# Patient Record
Sex: Female | Born: 1940 | Race: White | Hispanic: No
Health system: Midwestern US, Academic
[De-identification: ages and names within clinical notes are randomized; demographics above are authoritative.]

---

## 2018-06-10 ENCOUNTER — Encounter: Admit: 2018-06-10 | Discharge: 2018-06-11 | Payer: MEDICARE

## 2018-07-10 ENCOUNTER — Encounter: Admit: 2018-07-10 | Discharge: 2018-07-10 | Payer: MEDICARE

## 2018-07-11 ENCOUNTER — Encounter: Admit: 2018-07-11 | Discharge: 2018-07-11 | Payer: MEDICARE

## 2018-07-14 ENCOUNTER — Encounter: Admit: 2018-07-14 | Discharge: 2018-07-14 | Payer: MEDICARE

## 2018-07-14 DIAGNOSIS — E785 Hyperlipidemia, unspecified: ICD-10-CM

## 2018-07-14 DIAGNOSIS — C539 Malignant neoplasm of cervix uteri, unspecified: Principal | ICD-10-CM

## 2018-07-14 DIAGNOSIS — R51 Headache: ICD-10-CM

## 2018-07-14 DIAGNOSIS — C541 Malignant neoplasm of endometrium: ICD-10-CM

## 2018-07-14 DIAGNOSIS — I2510 Atherosclerotic heart disease of native coronary artery without angina pectoris: ICD-10-CM

## 2018-07-14 DIAGNOSIS — I10 Essential (primary) hypertension: ICD-10-CM

## 2018-07-14 DIAGNOSIS — H547 Unspecified visual loss: ICD-10-CM

## 2018-07-14 DIAGNOSIS — M549 Dorsalgia, unspecified: ICD-10-CM

## 2018-07-14 DIAGNOSIS — H9190 Unspecified hearing loss, unspecified ear: ICD-10-CM

## 2018-07-14 LAB — CBC AND DIFF
Lab: 0 % (ref 0–2)
Lab: 0 % (ref >60)
Lab: 0 10*3/uL (ref 0–0.20)
Lab: 0 10*3/uL (ref 0–0.45)
Lab: 0.2 10*3/uL (ref 0–0.80)
Lab: 0.3 10*3/uL — ABNORMAL LOW (ref 1.8–7.0)
Lab: 10 10*3/uL — ABNORMAL HIGH (ref 4.5–11.0)
Lab: 102 FL — ABNORMAL HIGH (ref 80–100)
Lab: 121 10*3/uL — ABNORMAL LOW (ref 150–400)
Lab: 15 % — ABNORMAL HIGH (ref 11–15)
Lab: 2 % — ABNORMAL LOW (ref >60)
Lab: 2.8 M/UL — ABNORMAL LOW (ref 4.0–5.0)
Lab: 29 % — ABNORMAL LOW (ref 36–45)
Lab: 3 % — ABNORMAL LOW (ref 41–77)
Lab: 33 g/dL (ref 32.0–36.0)
Lab: 34 pg — ABNORMAL HIGH (ref 26–34)
Lab: 7.8 FL (ref 7–11)
Lab: 9.6 10*3/uL — ABNORMAL HIGH (ref 1.0–4.8)
Lab: 95 % — ABNORMAL HIGH (ref 24–44)

## 2018-07-14 LAB — COMPREHENSIVE METABOLIC PANEL
Lab: 103 MMOL/L (ref 98–110)
Lab: 135 MMOL/L — ABNORMAL LOW (ref 137–147)
Lab: 4.3 MMOL/L (ref 3.5–5.1)

## 2018-07-15 ENCOUNTER — Encounter: Admit: 2018-07-15 | Discharge: 2018-07-15 | Payer: MEDICARE

## 2018-07-15 DIAGNOSIS — C541 Malignant neoplasm of endometrium: Principal | ICD-10-CM

## 2018-07-15 MED ORDER — CEFAZOLIN INJ 1GM IVP
2 g | Freq: Once | INTRAVENOUS | 0 refills | Status: CN
Start: 2018-07-15 — End: ?

## 2018-07-16 ENCOUNTER — Encounter: Admit: 2018-07-16 | Discharge: 2018-07-16 | Payer: MEDICARE

## 2018-07-16 DIAGNOSIS — H547 Unspecified visual loss: ICD-10-CM

## 2018-07-16 DIAGNOSIS — H9190 Unspecified hearing loss, unspecified ear: ICD-10-CM

## 2018-07-16 DIAGNOSIS — M549 Dorsalgia, unspecified: ICD-10-CM

## 2018-07-16 DIAGNOSIS — E785 Hyperlipidemia, unspecified: Principal | ICD-10-CM

## 2018-07-16 DIAGNOSIS — I2510 Atherosclerotic heart disease of native coronary artery without angina pectoris: ICD-10-CM

## 2018-07-16 DIAGNOSIS — R51 Headache: ICD-10-CM

## 2018-07-16 DIAGNOSIS — C541 Malignant neoplasm of endometrium: Principal | ICD-10-CM

## 2018-07-16 DIAGNOSIS — I10 Essential (primary) hypertension: ICD-10-CM

## 2018-07-17 ENCOUNTER — Encounter: Admit: 2018-07-17 | Discharge: 2018-07-17 | Payer: MEDICARE

## 2018-07-17 ENCOUNTER — Ambulatory Visit: Admit: 2018-07-17 | Discharge: 2018-07-18 | Payer: MEDICARE

## 2018-07-17 DIAGNOSIS — I25118 Atherosclerotic heart disease of native coronary artery with other forms of angina pectoris: ICD-10-CM

## 2018-07-17 DIAGNOSIS — M549 Dorsalgia, unspecified: ICD-10-CM

## 2018-07-17 DIAGNOSIS — I10 Essential (primary) hypertension: ICD-10-CM

## 2018-07-17 DIAGNOSIS — I2510 Atherosclerotic heart disease of native coronary artery without angina pectoris: ICD-10-CM

## 2018-07-17 DIAGNOSIS — E785 Hyperlipidemia, unspecified: Principal | ICD-10-CM

## 2018-07-17 DIAGNOSIS — R51 Headache: ICD-10-CM

## 2018-07-17 DIAGNOSIS — H547 Unspecified visual loss: ICD-10-CM

## 2018-07-17 DIAGNOSIS — H9190 Unspecified hearing loss, unspecified ear: ICD-10-CM

## 2018-07-17 DIAGNOSIS — Z01810 Encounter for preprocedural cardiovascular examination: Principal | ICD-10-CM

## 2018-07-17 DIAGNOSIS — I6523 Occlusion and stenosis of bilateral carotid arteries: ICD-10-CM

## 2018-07-23 ENCOUNTER — Encounter: Admit: 2018-07-23 | Discharge: 2018-07-24 | Payer: MEDICARE

## 2018-07-23 ENCOUNTER — Encounter: Admit: 2018-07-23 | Discharge: 2018-07-23 | Payer: MEDICARE

## 2018-07-23 DIAGNOSIS — R51 Headache: ICD-10-CM

## 2018-07-23 DIAGNOSIS — H9190 Unspecified hearing loss, unspecified ear: ICD-10-CM

## 2018-07-23 DIAGNOSIS — E785 Hyperlipidemia, unspecified: Principal | ICD-10-CM

## 2018-07-23 DIAGNOSIS — I10 Essential (primary) hypertension: ICD-10-CM

## 2018-07-23 DIAGNOSIS — M549 Dorsalgia, unspecified: ICD-10-CM

## 2018-07-23 DIAGNOSIS — C541 Malignant neoplasm of endometrium: Principal | ICD-10-CM

## 2018-07-23 DIAGNOSIS — H547 Unspecified visual loss: ICD-10-CM

## 2018-07-23 DIAGNOSIS — I2510 Atherosclerotic heart disease of native coronary artery without angina pectoris: ICD-10-CM

## 2018-07-23 MED ORDER — SODIUM CHLORIDE 0.9 % IJ SOLN
50 mL | Freq: Once | INTRAVENOUS | 0 refills | Status: CP
Start: 2018-07-23 — End: ?
  Administered 2018-07-23: 14:00:00 50 mL via INTRAVENOUS

## 2018-07-23 MED ORDER — SODIUM CHLORIDE 0.9 % IJ SOLN
50 mL | Freq: Once | INTRAVENOUS | 0 refills | Status: CP
Start: 2018-07-23 — End: ?
  Administered 2018-07-23: 15:00:00 50 mL via INTRAVENOUS

## 2018-07-23 MED ORDER — IOHEXOL 350 MG IODINE/ML IV SOLN
100 mL | Freq: Once | INTRAVENOUS | 0 refills | Status: CP
Start: 2018-07-23 — End: ?
  Administered 2018-07-23: 15:00:00 100 mL via INTRAVENOUS

## 2018-07-23 MED ORDER — GADOBENATE DIMEGLUMINE 529 MG/ML (0.1MMOL/0.2ML) IV SOLN
14 mL | Freq: Once | INTRAVENOUS | 0 refills | Status: CP
Start: 2018-07-23 — End: ?
  Administered 2018-07-23: 14:00:00 14 mL via INTRAVENOUS

## 2018-07-28 ENCOUNTER — Encounter: Admit: 2018-07-28 | Discharge: 2018-07-28 | Payer: MEDICARE

## 2018-07-28 ENCOUNTER — Inpatient Hospital Stay: Admit: 2018-07-28 | Discharge: 2018-07-28 | Payer: MEDICARE

## 2018-07-28 DIAGNOSIS — C541 Malignant neoplasm of endometrium: Principal | ICD-10-CM

## 2018-07-29 ENCOUNTER — Encounter: Admit: 2018-07-29 | Discharge: 2018-07-29 | Payer: MEDICARE

## 2018-07-29 ENCOUNTER — Ambulatory Visit: Admit: 2018-07-29 | Discharge: 2018-07-30 | Payer: MEDICARE

## 2018-07-29 DIAGNOSIS — I6529 Occlusion and stenosis of unspecified carotid artery: ICD-10-CM

## 2018-07-29 DIAGNOSIS — M549 Dorsalgia, unspecified: ICD-10-CM

## 2018-07-29 DIAGNOSIS — C541 Malignant neoplasm of endometrium: Principal | ICD-10-CM

## 2018-07-29 DIAGNOSIS — I2510 Atherosclerotic heart disease of native coronary artery without angina pectoris: ICD-10-CM

## 2018-07-29 DIAGNOSIS — I10 Essential (primary) hypertension: ICD-10-CM

## 2018-07-29 DIAGNOSIS — H547 Unspecified visual loss: ICD-10-CM

## 2018-07-29 DIAGNOSIS — R0602 Shortness of breath: ICD-10-CM

## 2018-07-29 DIAGNOSIS — R112 Nausea with vomiting, unspecified: ICD-10-CM

## 2018-07-29 DIAGNOSIS — E785 Hyperlipidemia, unspecified: Principal | ICD-10-CM

## 2018-07-29 DIAGNOSIS — Z9289 Personal history of other medical treatment: ICD-10-CM

## 2018-07-29 DIAGNOSIS — R51 Headache: ICD-10-CM

## 2018-07-29 DIAGNOSIS — K219 Gastro-esophageal reflux disease without esophagitis: ICD-10-CM

## 2018-07-29 DIAGNOSIS — Z01818 Encounter for other preprocedural examination: ICD-10-CM

## 2018-07-29 DIAGNOSIS — I208 Other forms of angina pectoris: ICD-10-CM

## 2018-07-29 DIAGNOSIS — H9190 Unspecified hearing loss, unspecified ear: ICD-10-CM

## 2018-07-29 DIAGNOSIS — Z955 Presence of coronary angioplasty implant and graft: ICD-10-CM

## 2018-07-29 LAB — CBC
Lab: 100 FL — ABNORMAL HIGH (ref 80–100)
Lab: 115 10*3/uL — ABNORMAL LOW (ref 150–400)
Lab: 15 % (ref 11–15)
Lab: 2.6 M/UL — ABNORMAL LOW (ref >60)
Lab: 26 % — ABNORMAL LOW (ref 36–45)
Lab: 34 g/dL (ref 32.0–36.0)
Lab: 34 pg — ABNORMAL HIGH (ref 26–34)
Lab: 7.7 FL (ref 7–11)
Lab: 9.1 g/dL — ABNORMAL LOW (ref >60)
Lab: 9.5 K/UL (ref 4.5–11.0)

## 2018-07-30 DIAGNOSIS — Z01818 Encounter for other preprocedural examination: Principal | ICD-10-CM

## 2018-08-05 ENCOUNTER — Encounter: Admit: 2018-08-05 | Discharge: 2018-08-05 | Payer: MEDICARE

## 2018-08-05 ENCOUNTER — Inpatient Hospital Stay
Admit: 2018-08-05 | Discharge: 2018-08-12 | Disposition: A | Discharge status: Home / Self Care | Payer: MEDICARE | Attending: Student in an Organized Health Care Education/Training Program | Admitting: Gynecologic Oncology

## 2018-08-05 ENCOUNTER — Ambulatory Visit: Admit: 2018-08-05 | Discharge: 2018-08-06 | Payer: MEDICARE

## 2018-08-05 DIAGNOSIS — K219 Gastro-esophageal reflux disease without esophagitis: ICD-10-CM

## 2018-08-05 DIAGNOSIS — I2510 Atherosclerotic heart disease of native coronary artery without angina pectoris: ICD-10-CM

## 2018-08-05 DIAGNOSIS — Z955 Presence of coronary angioplasty implant and graft: ICD-10-CM

## 2018-08-05 DIAGNOSIS — E785 Hyperlipidemia, unspecified: Principal | ICD-10-CM

## 2018-08-05 DIAGNOSIS — R112 Nausea with vomiting, unspecified: ICD-10-CM

## 2018-08-05 DIAGNOSIS — I6529 Occlusion and stenosis of unspecified carotid artery: ICD-10-CM

## 2018-08-05 DIAGNOSIS — I208 Other forms of angina pectoris: ICD-10-CM

## 2018-08-05 DIAGNOSIS — H547 Unspecified visual loss: ICD-10-CM

## 2018-08-05 DIAGNOSIS — M549 Dorsalgia, unspecified: ICD-10-CM

## 2018-08-05 DIAGNOSIS — R0602 Shortness of breath: ICD-10-CM

## 2018-08-05 DIAGNOSIS — R51 Headache: ICD-10-CM

## 2018-08-05 DIAGNOSIS — H9190 Unspecified hearing loss, unspecified ear: ICD-10-CM

## 2018-08-05 DIAGNOSIS — Z9289 Personal history of other medical treatment: ICD-10-CM

## 2018-08-05 DIAGNOSIS — I10 Essential (primary) hypertension: ICD-10-CM

## 2018-08-05 LAB — GLUCOSE,BG
Lab: 111 mg/dL — ABNORMAL HIGH (ref 70–100)
Lab: 153 mg/dL — ABNORMAL HIGH (ref 70–100)

## 2018-08-05 LAB — BLOOD GASES, ARTERIAL
Lab: 24 MMOL/L (ref 21–28)
Lab: 7.3 % — ABNORMAL LOW (ref 7.35–7.45)
Lab: 7.3 % — ABNORMAL LOW (ref 7.35–7.45)

## 2018-08-05 LAB — SODIUM,BG
Lab: 134 MMOL/L — ABNORMAL LOW (ref 137–147)
Lab: 133 MMOL/L — ABNORMAL LOW (ref 137–147)

## 2018-08-05 LAB — HEMOGLOBIN & HEMATOCRIT, BG
Lab: 9 g/dL — ABNORMAL LOW (ref 12.0–15.0)
Lab: 9.6 g/dL — ABNORMAL LOW (ref 12.0–15.0)

## 2018-08-05 LAB — POTASSIUM, BG
Lab: 4.4 MMOL/L — ABNORMAL HIGH (ref 3.5–5.1)
Lab: 4.4 MMOL/L — ABNORMAL HIGH (ref >60)

## 2018-08-05 LAB — IONIZED CALCIUM,BG
Lab: 1.1 MMOL/L — ABNORMAL HIGH (ref 1.0–1.3)
Lab: 1 MMOL/L — ABNORMAL HIGH (ref 1.0–1.3)

## 2018-08-05 LAB — LACTIC ACID (BG - RAPID LACTATE): Lab: 0.9 MMOL/L (ref >60)

## 2018-08-05 MED ORDER — ONDANSETRON HCL (PF) 4 MG/2 ML IJ SOLN
4 mg | INTRAVENOUS | 0 refills | Status: DC | PRN
Start: 2018-08-05 — End: 2018-08-06
  Administered 2018-08-06: 16:00:00 4 mg via INTRAVENOUS

## 2018-08-05 MED ORDER — ROCURONIUM 10 MG/ML IV SOLN
INTRAVENOUS | 0 refills | Status: DC
Start: 2018-08-05 — End: 2018-08-06
  Administered 2018-08-05: 23:00:00 10 mg via INTRAVENOUS
  Administered 2018-08-05: 20:00:00 50 mg via INTRAVENOUS
  Administered 2018-08-05: 22:00:00 10 mg via INTRAVENOUS

## 2018-08-05 MED ORDER — MORPHINE PCA 50 MG/50 ML SYR (STD CONC)(ADULT)
INTRAVENOUS | 0 refills | Status: DC
Start: 2018-08-05 — End: 2018-08-06
  Administered 2018-08-06: 02:00:00 50.000 mL via INTRAVENOUS

## 2018-08-05 MED ORDER — ACETAMINOPHEN 500 MG PO TAB
1000 mg | Freq: Once | ORAL | 0 refills | Status: CP
Start: 2018-08-05 — End: ?
  Administered 2018-08-05: 19:00:00 1000 mg via ORAL

## 2018-08-05 MED ORDER — ASPIRIN 81 MG PO TBEC
81 mg | Freq: Every evening | ORAL | 0 refills | Status: DC
Start: 2018-08-05 — End: 2018-08-08
  Administered 2018-08-07 – 2018-08-08 (×2): 81 mg via ORAL

## 2018-08-05 MED ORDER — ENOXAPARIN 40 MG/0.4 ML SC SYRG
40 mg | Freq: Every day | SUBCUTANEOUS | 0 refills | Status: DC
Start: 2018-08-05 — End: 2018-08-12
  Administered 2018-08-06 – 2018-08-11 (×6): 40 mg via SUBCUTANEOUS

## 2018-08-05 MED ORDER — CETIRIZINE 10 MG PO TAB
10 mg | Freq: Every evening | ORAL | 0 refills | Status: DC
Start: 2018-08-05 — End: 2018-08-08
  Administered 2018-08-06 – 2018-08-08 (×3): 10 mg via ORAL

## 2018-08-05 MED ORDER — SENNOSIDES-DOCUSATE SODIUM 8.6-50 MG PO TAB
1 | Freq: Two times a day (BID) | ORAL | 0 refills | Status: DC
Start: 2018-08-05 — End: 2018-08-08
  Administered 2018-08-06 – 2018-08-08 (×5): 1 via ORAL

## 2018-08-05 MED ORDER — ONDANSETRON HCL (PF) 4 MG/2 ML IJ SOLN
INTRAVENOUS | 0 refills | Status: DC
Start: 2018-08-05 — End: 2018-08-06
  Administered 2018-08-06: 4 mg via INTRAVENOUS

## 2018-08-05 MED ORDER — LIDOCAINE (PF) 10 MG/ML (1 %) IJ SOLN
.1-2 mL | INTRAMUSCULAR | 0 refills | Status: DC | PRN
Start: 2018-08-05 — End: 2018-08-06

## 2018-08-05 MED ORDER — HYDROMORPHONE (PF) 2 MG/ML IJ SYRG
.5 mg | INTRAVENOUS | 0 refills | Status: DC | PRN
Start: 2018-08-05 — End: 2018-08-06
  Administered 2018-08-06 (×4): 0.5 mg via INTRAVENOUS

## 2018-08-05 MED ORDER — OXYCODONE 5 MG PO TAB
5-10 mg | Freq: Once | ORAL | 0 refills | Status: DC | PRN
Start: 2018-08-05 — End: 2018-08-06

## 2018-08-05 MED ORDER — SODIUM CHLORIDE 0.9 % IV SOLP
INTRAVENOUS | 0 refills | Status: DC
Start: 2018-08-05 — End: 2018-08-06
  Administered 2018-08-06: 03:00:00 1000.000 mL via INTRAVENOUS

## 2018-08-05 MED ORDER — BUPIVACAINE LIPOSOMAL/BUPIVACAINE HCL INJECTION
Freq: Once | 0 refills | Status: CP
Start: 2018-08-05 — End: ?
  Administered 2018-08-05 (×2): 80 mL

## 2018-08-05 MED ORDER — PANTOPRAZOLE 40 MG PO TBEC
40 mg | Freq: Every day | ORAL | 0 refills | Status: DC
Start: 2018-08-05 — End: 2018-08-08
  Administered 2018-08-06 – 2018-08-08 (×3): 40 mg via ORAL

## 2018-08-05 MED ORDER — DEXTRAN 70-HYPROMELLOSE (PF) 0.1-0.3 % OP DPET
0 refills | Status: DC
Start: 2018-08-05 — End: 2018-08-06
  Administered 2018-08-05: 20:00:00 2 [drp] via OPHTHALMIC

## 2018-08-05 MED ORDER — PROPOFOL INJ 10 MG/ML IV VIAL
0 refills | Status: DC
Start: 2018-08-05 — End: 2018-08-06
  Administered 2018-08-05: 20:00:00 80 mg via INTRAVENOUS

## 2018-08-05 MED ORDER — ROSUVASTATIN 10 MG PO TAB
20 mg | Freq: Every evening | ORAL | 0 refills | Status: DC
Start: 2018-08-05 — End: 2018-08-08
  Administered 2018-08-06 – 2018-08-08 (×3): 20 mg via ORAL

## 2018-08-05 MED ORDER — ACETAMINOPHEN 500 MG PO TAB
1000 mg | ORAL | 0 refills | Status: DC | PRN
Start: 2018-08-05 — End: 2018-08-06
  Administered 2018-08-06 (×2): 1000 mg via ORAL

## 2018-08-05 MED ORDER — IOPAMIDOL 61 % 50 ML IV SOLN (OT)(OSM)
0 refills | Status: DC
Start: 2018-08-05 — End: 2018-08-06
  Administered 2018-08-06: 01:00:00 25 mL via INTRAMUSCULAR

## 2018-08-05 MED ORDER — HALOPERIDOL LACTATE 5 MG/ML IJ SOLN
1 mg | Freq: Once | INTRAVENOUS | 0 refills | Status: DC | PRN
Start: 2018-08-05 — End: 2018-08-06

## 2018-08-05 MED ORDER — LIDOCAINE (PF) 200 MG/10 ML (2 %) IJ SYRG
0 refills | Status: DC
Start: 2018-08-05 — End: 2018-08-06
  Administered 2018-08-05: 20:00:00 60 mg via INTRAVENOUS

## 2018-08-05 MED ORDER — LACTATED RINGERS IV SOLP
1000 mL | INTRAVENOUS | 0 refills | Status: DC
Start: 2018-08-05 — End: 2018-08-07
  Administered 2018-08-05: 23:00:00 1000.000 mL via INTRAVENOUS

## 2018-08-05 MED ORDER — METOPROLOL SUCCINATE 50 MG PO TB24
50 mg | Freq: Every evening | ORAL | 0 refills | Status: DC
Start: 2018-08-05 — End: 2018-08-08
  Administered 2018-08-06 – 2018-08-08 (×3): 50 mg via ORAL

## 2018-08-05 MED ORDER — SUGAMMADEX 100 MG/ML IV SOLN
INTRAVENOUS | 0 refills | Status: DC
Start: 2018-08-05 — End: 2018-08-06
  Administered 2018-08-06: 01:00:00 140 mg via INTRAVENOUS

## 2018-08-05 MED ORDER — ELECTROLYTE-A IV SOLP
0 refills | Status: DC
Start: 2018-08-05 — End: 2018-08-06
  Administered 2018-08-05: 20:00:00 via INTRAVENOUS

## 2018-08-05 MED ORDER — FENTANYL CITRATE (PF) 50 MCG/ML IJ SOLN
0 refills | Status: DC
Start: 2018-08-05 — End: 2018-08-06
  Administered 2018-08-05 (×2): 25 ug via INTRAVENOUS
  Administered 2018-08-05: 22:00:00 50 ug via INTRAVENOUS
  Administered 2018-08-05: 20:00:00 100 ug via INTRAVENOUS
  Administered 2018-08-05: 23:00:00 50 ug via INTRAVENOUS

## 2018-08-05 MED ORDER — MIDAZOLAM 1 MG/ML IJ SOLN
INTRAVENOUS | 0 refills | Status: DC
Start: 2018-08-05 — End: 2018-08-06
  Administered 2018-08-05: 19:00:00 2 mg via INTRAVENOUS

## 2018-08-05 MED ORDER — DEXAMETHASONE SODIUM PHOSPHATE 4 MG/ML IJ SOLN
INTRAVENOUS | 0 refills | Status: DC
Start: 2018-08-05 — End: 2018-08-06
  Administered 2018-08-05: 20:00:00 4 mg via INTRAVENOUS

## 2018-08-05 MED ORDER — CEFAZOLIN INJ 1GM IVP
2 g | Freq: Once | INTRAVENOUS | 0 refills | Status: CP
Start: 2018-08-05 — End: ?
  Administered 2018-08-05 – 2018-08-06 (×2): 2 g via INTRAVENOUS

## 2018-08-05 MED ORDER — POLYETHYLENE GLYCOL 3350 17 GRAM PO PWPK
17 g | Freq: Every day | ORAL | 0 refills | Status: DC
Start: 2018-08-05 — End: 2018-08-08
  Administered 2018-08-06: 13:00:00 17 g via ORAL

## 2018-08-05 MED ORDER — FENTANYL CITRATE (PF) 50 MCG/ML IJ SOLN
25-50 ug | INTRAVENOUS | 0 refills | Status: DC | PRN
Start: 2018-08-05 — End: 2018-08-06
  Administered 2018-08-06 (×2): 50 ug via INTRAVENOUS

## 2018-08-05 MED ORDER — ELECTROLYTE-R (PH 7.4) IV SOLP
0 refills | Status: DC
Start: 2018-08-05 — End: 2018-08-05

## 2018-08-05 MED ORDER — NALOXONE 0.4 MG/ML IJ SOLN
.08 mg | INTRAVENOUS | 0 refills | Status: DC | PRN
Start: 2018-08-05 — End: 2018-08-06

## 2018-08-05 MED ADMIN — LACTATED RINGERS IV SOLP [4318]: 1000 mL | INTRAVENOUS | @ 18:00:00 | Stop: 2018-08-05 | NDC 00338011704

## 2018-08-06 ENCOUNTER — Encounter: Admit: 2018-08-06 | Discharge: 2018-08-06 | Payer: MEDICARE

## 2018-08-06 LAB — CBC
Lab: 100 10*3/uL — ABNORMAL LOW (ref 150–400)
Lab: 14 10*3/uL — ABNORMAL HIGH (ref 4.5–11.0)
Lab: 17 % — ABNORMAL HIGH (ref 11–15)
Lab: 2.4 M/UL — ABNORMAL LOW (ref 4.0–5.0)
Lab: 23 % — ABNORMAL LOW (ref 36–45)
Lab: 33 pg (ref 26–34)
Lab: 35 g/dL (ref 32.0–36.0)
Lab: 7.6 FL (ref 7–11)
Lab: 7.8 10*3/uL (ref 4.5–11.0)
Lab: 8.4 g/dL — ABNORMAL LOW (ref 12.0–15.0)
Lab: 96 FL (ref 80–100)

## 2018-08-06 LAB — BASIC METABOLIC PANEL
Lab: 0.7 mg/dL (ref 0.4–1.00)
Lab: 103 MMOL/L (ref 98–110)
Lab: 11 mg/dL (ref 7–25)
Lab: 122 mg/dL — ABNORMAL HIGH (ref 70–100)
Lab: 133 MMOL/L — ABNORMAL LOW (ref 137–147)
Lab: 23 MMOL/L (ref 21–30)
Lab: 4.8 MMOL/L (ref 3.5–5.1)
Lab: 8 mg/dL — ABNORMAL LOW (ref 8.5–10.6)
Lab: >60 mL/min (ref >60)
Lab: >60 mL/min (ref >60)
Lab: 7 (ref 3–12)

## 2018-08-06 LAB — LACTIC ACID (BG - RAPID LACTATE): Lab: 0.9 MMOL/L (ref 0.5–2.0)

## 2018-08-06 LAB — POTASSIUM, BG: Lab: 4.8 MMOL/L — ABNORMAL HIGH (ref 3.5–5.1)

## 2018-08-06 LAB — GLUCOSE,BG: Lab: 142 mg/dL — ABNORMAL HIGH (ref 70–100)

## 2018-08-06 LAB — MAGNESIUM: Lab: 2.1 mg/dL — ABNORMAL LOW (ref >60)

## 2018-08-06 LAB — PHOSPHORUS: Lab: 3.4 mg/dL — ABNORMAL LOW (ref >60)

## 2018-08-06 LAB — SODIUM,BG: Lab: 132 MMOL/L — ABNORMAL LOW (ref 137–147)

## 2018-08-06 LAB — IONIZED CALCIUM,BG: Lab: 1 MMOL/L — ABNORMAL HIGH (ref 1.0–1.3)

## 2018-08-06 LAB — BLOOD GASES, ARTERIAL: Lab: 7.3 (ref 7.35–7.45)

## 2018-08-06 MED ORDER — SIMETHICONE 80 MG PO CHEW
80 mg | ORAL | 0 refills | Status: DC | PRN
Start: 2018-08-06 — End: 2018-08-08
  Administered 2018-08-07: 19:00:00 80 mg via ORAL

## 2018-08-06 MED ORDER — PROCHLORPERAZINE EDISYLATE 5 MG/ML IJ SOLN
10 mg | INTRAVENOUS | 0 refills | Status: DC | PRN
Start: 2018-08-06 — End: 2018-08-08
  Administered 2018-08-06 – 2018-08-07 (×2): 10 mg via INTRAVENOUS

## 2018-08-06 MED ORDER — SODIUM CHLORIDE 0.9 % IV SOLP
INTRAVENOUS | 0 refills | Status: DC
Start: 2018-08-06 — End: 2018-08-08
  Administered 2018-08-06 – 2018-08-08 (×4): 1000.000 mL via INTRAVENOUS

## 2018-08-06 MED ORDER — HYDROCODONE-ACETAMINOPHEN 5-325 MG PO TAB
1-2 | ORAL | 0 refills | Status: DC | PRN
Start: 2018-08-06 — End: 2018-08-07
  Administered 2018-08-06 – 2018-08-07 (×3): 1 via ORAL

## 2018-08-06 MED ORDER — ENOXAPARIN 40 MG/0.4 ML SC SYRG
40 mg | INJECTION | Freq: Every day | SUBCUTANEOUS | 0 refills | Status: SS
Start: 2018-08-06 — End: 2018-09-24

## 2018-08-06 MED ORDER — OXYCODONE 5 MG PO TAB
5-10 mg | ORAL | 0 refills | Status: DC | PRN
Start: 2018-08-06 — End: 2018-08-06
  Administered 2018-08-06: 13:00:00 5 mg via ORAL

## 2018-08-06 MED ORDER — ONDANSETRON HCL (PF) 4 MG/2 ML IJ SOLN
4 mg | INTRAVENOUS | 0 refills | Status: DC | PRN
Start: 2018-08-06 — End: 2018-08-12
  Administered 2018-08-07 – 2018-08-11 (×3): 4 mg via INTRAVENOUS

## 2018-08-06 MED ORDER — FENTANYL CITRATE (PF) 50 MCG/ML IJ SOLN
25 ug | INTRAVENOUS | 0 refills | Status: DC | PRN
Start: 2018-08-06 — End: 2018-08-12
  Administered 2018-08-06 – 2018-08-10 (×3): 25 ug via INTRAVENOUS

## 2018-08-07 ENCOUNTER — Inpatient Hospital Stay: Admit: 2018-08-07 | Discharge: 2018-08-07 | Payer: MEDICARE

## 2018-08-07 ENCOUNTER — Encounter: Admit: 2018-08-07 | Discharge: 2018-08-07 | Payer: MEDICARE

## 2018-08-07 DIAGNOSIS — E785 Hyperlipidemia, unspecified: Principal | ICD-10-CM

## 2018-08-07 DIAGNOSIS — I10 Essential (primary) hypertension: ICD-10-CM

## 2018-08-07 DIAGNOSIS — Z9289 Personal history of other medical treatment: ICD-10-CM

## 2018-08-07 DIAGNOSIS — H9190 Unspecified hearing loss, unspecified ear: ICD-10-CM

## 2018-08-07 DIAGNOSIS — M549 Dorsalgia, unspecified: ICD-10-CM

## 2018-08-07 DIAGNOSIS — I6529 Occlusion and stenosis of unspecified carotid artery: ICD-10-CM

## 2018-08-07 DIAGNOSIS — I2510 Atherosclerotic heart disease of native coronary artery without angina pectoris: ICD-10-CM

## 2018-08-07 DIAGNOSIS — R112 Nausea with vomiting, unspecified: ICD-10-CM

## 2018-08-07 DIAGNOSIS — R51 Headache: ICD-10-CM

## 2018-08-07 DIAGNOSIS — Z955 Presence of coronary angioplasty implant and graft: ICD-10-CM

## 2018-08-07 DIAGNOSIS — K219 Gastro-esophageal reflux disease without esophagitis: ICD-10-CM

## 2018-08-07 DIAGNOSIS — I208 Other forms of angina pectoris: ICD-10-CM

## 2018-08-07 DIAGNOSIS — R0602 Shortness of breath: ICD-10-CM

## 2018-08-07 DIAGNOSIS — H547 Unspecified visual loss: ICD-10-CM

## 2018-08-07 LAB — BASIC METABOLIC PANEL
Lab: 0.9 mg/dL (ref 0.4–1.00)
Lab: 104 MMOL/L (ref 98–110)
Lab: 112 mg/dL — ABNORMAL HIGH (ref 70–100)
Lab: 132 MMOL/L — ABNORMAL LOW (ref 137–147)
Lab: 18 mg/dL (ref 7–25)
Lab: 21 MMOL/L (ref 21–30)
Lab: 4.6 MMOL/L (ref 3.5–5.1)
Lab: 55 mL/min — ABNORMAL LOW (ref >60)
Lab: 7 (ref 3–12)
Lab: 7.7 mg/dL — ABNORMAL LOW (ref 8.5–10.6)

## 2018-08-07 LAB — PHOSPHORUS: Lab: 2.4 mg/dL — ABNORMAL HIGH (ref 2.0–4.5)

## 2018-08-07 LAB — CBC: Lab: 17 K/UL — ABNORMAL HIGH (ref >60)

## 2018-08-07 LAB — MAGNESIUM: Lab: 2 mg/dL — ABNORMAL LOW (ref 1.6–2.6)

## 2018-08-07 MED ORDER — SODIUM CHLORIDE 0.9 % IV SOLP
1000 mL | INTRAVENOUS | 0 refills | Status: CP
Start: 2018-08-07 — End: ?
  Administered 2018-08-07: 17:00:00 1000 mL via INTRAVENOUS

## 2018-08-07 MED ORDER — IBUPROFEN 600 MG PO TAB
600 mg | ORAL | 0 refills | Status: DC | PRN
Start: 2018-08-07 — End: 2018-08-08

## 2018-08-07 MED ORDER — MAGNESIUM HYDROXIDE 2,400 MG/10 ML PO SUSP
10 mL | ORAL | 0 refills | Status: DC | PRN
Start: 2018-08-07 — End: 2018-08-08

## 2018-08-07 MED ORDER — CALCIUM CARBONATE 200 MG CALCIUM (500 MG) PO CHEW
500-1000 mg | Freq: Every day | ORAL | 0 refills | Status: DC
Start: 2018-08-07 — End: 2018-08-08
  Administered 2018-08-07: 19:00:00 1000 mg via ORAL

## 2018-08-07 MED ORDER — ACETAMINOPHEN 500 MG PO TAB
1000 mg | ORAL | 0 refills | Status: DC
Start: 2018-08-07 — End: 2018-08-09
  Administered 2018-08-07 – 2018-08-08 (×4): 1000 mg via ORAL

## 2018-08-07 MED ORDER — LORAZEPAM 2 MG/ML IJ SOLN
.5-1 mg | Freq: Once | INTRAVENOUS | 0 refills | Status: CP
Start: 2018-08-07 — End: ?
  Administered 2018-08-07: 1 mg via INTRAVENOUS

## 2018-08-07 MED ORDER — PROMETHAZINE 25 MG/ML IJ SOLN
12.5 mg | INTRAVENOUS | 0 refills | Status: DC | PRN
Start: 2018-08-07 — End: 2018-08-12
  Administered 2018-08-07: 20:00:00 12.5 mg via INTRAVENOUS

## 2018-08-08 LAB — BASIC METABOLIC PANEL: Lab: 135 MMOL/L — ABNORMAL LOW (ref >60)

## 2018-08-08 LAB — CBC: Lab: 9.8 K/UL — ABNORMAL LOW (ref >60)

## 2018-08-08 LAB — DRAIN FLUID CREATININE: Lab: 0.6 mg/dL (ref 26–34)

## 2018-08-08 LAB — MAGNESIUM: Lab: 1.8 mg/dL — ABNORMAL LOW (ref >60)

## 2018-08-08 LAB — PHOSPHORUS: Lab: 1.9 mg/dL — ABNORMAL LOW (ref >60)

## 2018-08-08 LAB — LACTIC ACID(LACTATE): Lab: 0.8 MMOL/L (ref 0.5–2.0)

## 2018-08-08 MED ORDER — ACETAMINOPHEN 500 MG PO TAB
1000 mg | NASOGASTRIC | 0 refills | Status: DC
Start: 2018-08-08 — End: 2018-08-09

## 2018-08-08 MED ORDER — ACETAMINOPHEN 160 MG/5 ML PO SOLN
1000 mg | NASOGASTRIC | 0 refills | Status: DC
Start: 2018-08-08 — End: 2018-08-10
  Administered 2018-08-09 – 2018-08-10 (×5): 1000 mg via NASOGASTRIC

## 2018-08-08 MED ORDER — PANTOPRAZOLE 40 MG IV SOLR
40 mg | Freq: Every day | INTRAVENOUS | 0 refills | Status: DC
Start: 2018-08-08 — End: 2018-08-10
  Administered 2018-08-08 – 2018-08-10 (×3): 40 mg via INTRAVENOUS

## 2018-08-08 MED ORDER — METOPROLOL TARTRATE 5 MG/5 ML IV SOLN
2.5 mg | INTRAVENOUS | 0 refills | Status: DC
Start: 2018-08-08 — End: 2018-08-10
  Administered 2018-08-08 – 2018-08-10 (×9): 2.5 mg via INTRAVENOUS

## 2018-08-08 MED ORDER — SODIUM CHLORIDE 0.9 % IV SOLP
500 mL | INTRAVENOUS | 0 refills | Status: AC
Start: 2018-08-08 — End: ?

## 2018-08-08 MED ORDER — PHENOL 1.4 % MM SPRA
2 | OROMUCOSAL | 0 refills | Status: DC | PRN
Start: 2018-08-08 — End: 2018-08-12

## 2018-08-08 MED ORDER — SODIUM CHLORIDE 0.9 % IV SOLP
INTRAVENOUS | 0 refills | Status: DC
Start: 2018-08-08 — End: 2018-08-12
  Administered 2018-08-08 – 2018-08-11 (×8): 1000.000 mL via INTRAVENOUS

## 2018-08-09 LAB — BASIC METABOLIC PANEL: Lab: 136 MMOL/L — ABNORMAL LOW (ref >60)

## 2018-08-09 LAB — CBC: Lab: 8.7 10*3/uL — ABNORMAL HIGH (ref 4.5–11.0)

## 2018-08-09 LAB — PHOSPHORUS: Lab: 1.9 mg/dL — ABNORMAL LOW (ref 2.0–4.5)

## 2018-08-09 LAB — MAGNESIUM: Lab: 1.8 mg/dL — ABNORMAL LOW (ref 1.6–2.6)

## 2018-08-09 MED ORDER — SODIUM PHOSPHATE IVPB
20 MMOL | Freq: Once | INTRAVENOUS | 0 refills | Status: CP
Start: 2018-08-09 — End: ?
  Administered 2018-08-09 (×2): 20 mmol via INTRAVENOUS

## 2018-08-10 LAB — C DIFFICILE BY PCR: Lab: NEGATIVE {cells}/uL — ABNORMAL HIGH (ref 9–46)

## 2018-08-10 LAB — BASIC METABOLIC PANEL: Lab: 139 MMOL/L — ABNORMAL HIGH (ref 137–147)

## 2018-08-10 LAB — PHOSPHORUS: Lab: 1.9 mg/dL — ABNORMAL LOW (ref 2.0–4.5)

## 2018-08-10 LAB — CBC: Lab: 7.2 K/UL — ABNORMAL HIGH (ref >60)

## 2018-08-10 LAB — MAGNESIUM: Lab: 1.7 mg/dL — ABNORMAL LOW (ref >60)

## 2018-08-10 MED ORDER — METOPROLOL SUCCINATE 50 MG PO TB24
50 mg | Freq: Every evening | ORAL | 0 refills | Status: DC
Start: 2018-08-10 — End: 2018-08-12
  Administered 2018-08-11: 01:00:00 50 mg via ORAL

## 2018-08-10 MED ORDER — VANCOMYCIN 25 MG/ML PO SOLR
125 mg | Freq: Four times a day (QID) | ORAL | 0 refills | Status: DC
Start: 2018-08-10 — End: 2018-08-10
  Administered 2018-08-10 (×2): 125 mg via ORAL

## 2018-08-10 MED ORDER — SODIUM PHOSPHATE IVPB
20 MMOL | Freq: Once | INTRAVENOUS | 0 refills | Status: CP
Start: 2018-08-10 — End: ?
  Administered 2018-08-10 (×2): 20 mmol via INTRAVENOUS

## 2018-08-10 MED ORDER — MELATONIN 5 MG PO TAB
5 mg | Freq: Every evening | ORAL | 0 refills | Status: DC
Start: 2018-08-10 — End: 2018-08-12
  Administered 2018-08-11: 05:00:00 5 mg via ORAL

## 2018-08-10 MED ORDER — PANTOPRAZOLE 40 MG PO TBEC
40 mg | Freq: Every day | ORAL | 0 refills | Status: DC
Start: 2018-08-10 — End: 2018-08-12
  Administered 2018-08-11: 01:00:00 40 mg via ORAL

## 2018-08-10 MED ORDER — ACETAMINOPHEN 500 MG PO TAB
1000 mg | ORAL | 0 refills | Status: DC | PRN
Start: 2018-08-10 — End: 2018-08-12
  Administered 2018-08-11 (×2): 1000 mg via ORAL

## 2018-08-10 MED ORDER — LOPERAMIDE 2 MG PO CAP
2 mg | ORAL | 0 refills | Status: DC | PRN
Start: 2018-08-10 — End: 2018-08-12
  Administered 2018-08-11: 03:00:00 2 mg via ORAL

## 2018-08-11 ENCOUNTER — Encounter: Admit: 2018-08-11 | Discharge: 2018-08-11 | Payer: MEDICARE

## 2018-08-11 LAB — MAGNESIUM: Lab: 1.5 mg/dL — ABNORMAL LOW (ref 1.6–2.6)

## 2018-08-11 LAB — PHOSPHORUS: Lab: 2.2 mg/dL — ABNORMAL LOW (ref 2.0–4.5)

## 2018-08-11 LAB — BASIC METABOLIC PANEL: Lab: 135 MMOL/L — ABNORMAL LOW (ref >60)

## 2018-08-11 MED ORDER — POLYETHYLENE GLYCOL 3350 17 GRAM PO PWPK
17 g | Freq: Every day | ORAL | 0 refills | Status: SS
Start: 2018-08-11 — End: 2018-08-16

## 2018-08-11 MED ORDER — OXYCODONE 5 MG PO TAB
5 mg | ORAL_TABLET | ORAL | 0 refills | 6.00000 days | Status: AC | PRN
Start: 2018-08-11 — End: 2018-10-14
  Filled 2018-08-11 (×2): qty 10, 2d supply, fill #1

## 2018-08-11 MED ORDER — SENNOSIDES-DOCUSATE SODIUM 8.6-50 MG PO TAB
1-2 | ORAL_TABLET | Freq: Two times a day (BID) | ORAL | 0 refills | Status: AC
Start: 2018-08-11 — End: ?

## 2018-08-11 MED FILL — ENOXAPARIN 40 MG/0.4 ML SC SYRG: 40 mg | SUBCUTANEOUS | 30 days supply | Qty: 30 | Fill #1 | Status: CP

## 2018-08-12 ENCOUNTER — Inpatient Hospital Stay: Admit: 2018-08-05 | Discharge: 2018-08-05 | Payer: MEDICARE

## 2018-08-12 DIAGNOSIS — E785 Hyperlipidemia, unspecified: ICD-10-CM

## 2018-08-12 DIAGNOSIS — K567 Ileus, unspecified: ICD-10-CM

## 2018-08-12 DIAGNOSIS — C775 Secondary and unspecified malignant neoplasm of intrapelvic lymph nodes: ICD-10-CM

## 2018-08-12 DIAGNOSIS — I2510 Atherosclerotic heart disease of native coronary artery without angina pectoris: ICD-10-CM

## 2018-08-12 DIAGNOSIS — I10 Essential (primary) hypertension: ICD-10-CM

## 2018-08-12 DIAGNOSIS — K219 Gastro-esophageal reflux disease without esophagitis: ICD-10-CM

## 2018-08-12 DIAGNOSIS — R197 Diarrhea, unspecified: ICD-10-CM

## 2018-08-12 DIAGNOSIS — D62 Acute posthemorrhagic anemia: ICD-10-CM

## 2018-08-12 DIAGNOSIS — C541 Malignant neoplasm of endometrium: Principal | ICD-10-CM

## 2018-08-12 DIAGNOSIS — Z955 Presence of coronary angioplasty implant and graft: ICD-10-CM

## 2018-08-13 ENCOUNTER — Encounter: Admit: 2018-08-13 | Discharge: 2018-08-13 | Payer: MEDICARE

## 2018-08-14 ENCOUNTER — Encounter: Admit: 2018-08-14 | Discharge: 2018-08-14 | Payer: MEDICARE

## 2018-08-14 ENCOUNTER — Emergency Department
Admit: 2018-08-14 | Discharge: 2018-08-15 | Payer: MEDICARE | Attending: Student in an Organized Health Care Education/Training Program

## 2018-08-14 LAB — URINALYSIS, MICROSCOPIC

## 2018-08-14 LAB — CBC AND DIFF
Lab: 11 K/UL — ABNORMAL HIGH (ref 4.5–11.0)
Lab: 2.8 M/UL — ABNORMAL LOW (ref 4.0–5.0)
Lab: 4 10*3/uL (ref 1.8–7.0)
Lab: 9.4 g/dL — ABNORMAL LOW (ref 12.0–15.0)

## 2018-08-14 LAB — URINALYSIS DIPSTICK
Lab: NEGATIVE
Lab: POSITIVE — AB

## 2018-08-14 LAB — COMPREHENSIVE METABOLIC PANEL
Lab: 133 MMOL/L — ABNORMAL LOW (ref 137–147)
Lab: 3.3 MMOL/L — ABNORMAL LOW (ref 3.5–5.1)

## 2018-08-14 LAB — POC LACTATE: Lab: 1.3 MMOL/L (ref 0.5–2.0)

## 2018-08-14 LAB — POC CREATININE, RAD: Lab: 0.7 mg/dL — ABNORMAL HIGH (ref 0.4–1.00)

## 2018-08-14 LAB — DRAIN FLUID CREATININE: Lab: 0.6 mg/dL

## 2018-08-14 MED ORDER — PIPERACILLIN/TAZOBACTAM 4.5 G/NS IVPB (MB+)
4.5 g | Freq: Once | INTRAVENOUS | 0 refills | Status: CP
Start: 2018-08-14 — End: ?
  Administered 2018-08-14 (×2): 4.5 g via INTRAVENOUS

## 2018-08-14 MED ORDER — FAMOTIDINE 20 MG PO TAB
20 mg | Freq: Two times a day (BID) | ORAL | 0 refills | Status: DC
Start: 2018-08-14 — End: 2018-08-17
  Administered 2018-08-14 – 2018-08-17 (×7): 20 mg via ORAL

## 2018-08-14 MED ORDER — METOPROLOL SUCCINATE 50 MG PO TB24
50 mg | Freq: Every evening | ORAL | 0 refills | Status: DC
Start: 2018-08-14 — End: 2018-08-17
  Administered 2018-08-15 – 2018-08-17 (×3): 50 mg via ORAL

## 2018-08-14 MED ORDER — POTASSIUM CHLORIDE IN WATER 10 MEQ/50 ML IV PGBK
10 meq | INTRAVENOUS | 0 refills | Status: CP
Start: 2018-08-14 — End: ?
  Administered 2018-08-14 (×4): 10 meq via INTRAVENOUS

## 2018-08-14 MED ORDER — IOHEXOL 350 MG IODINE/ML IV SOLN
80 mL | Freq: Once | INTRAVENOUS | 0 refills | Status: CP
Start: 2018-08-14 — End: ?
  Administered 2018-08-14: 08:00:00 80 mL via INTRAVENOUS

## 2018-08-14 MED ORDER — ENOXAPARIN 40 MG/0.4 ML SC SYRG
40 mg | Freq: Every day | SUBCUTANEOUS | 0 refills | Status: DC
Start: 2018-08-14 — End: 2018-08-14

## 2018-08-14 MED ORDER — SODIUM CHLORIDE 0.9 % IJ SOLN
50 mL | Freq: Once | INTRAVENOUS | 0 refills | Status: CP
Start: 2018-08-14 — End: ?
  Administered 2018-08-14: 08:00:00 50 mL via INTRAVENOUS

## 2018-08-14 MED ORDER — VANCOMYCIN 1G/250ML D5W IVPB (VIAL2BAG)
15 mg/kg | Freq: Once | INTRAVENOUS | 0 refills | Status: CP
Start: 2018-08-14 — End: ?
  Administered 2018-08-14 (×2): 1000 mg via INTRAVENOUS

## 2018-08-14 MED ORDER — SODIUM CHLORIDE 0.9 % IV SOLP
INTRAVENOUS | 0 refills | Status: DC
Start: 2018-08-14 — End: 2018-08-15
  Administered 2018-08-14 – 2018-08-15 (×2): 1000.000 mL via INTRAVENOUS

## 2018-08-14 MED ORDER — (INV) ENOXAPARIN 40MG SC SYRINGE
40 mg | Freq: Every day | SUBCUTANEOUS | 0 refills | Status: DC
Start: 2018-08-14 — End: 2018-08-14

## 2018-08-14 MED ORDER — ACETAMINOPHEN 500 MG PO TAB
1000 mg | ORAL | 0 refills | Status: DC | PRN
Start: 2018-08-14 — End: 2018-08-17
  Administered 2018-08-14 – 2018-08-17 (×4): 1000 mg via ORAL

## 2018-08-14 MED ORDER — FLUCONAZOLE 150 MG PO TAB
150 mg | Freq: Once | ORAL | 0 refills | Status: DC
Start: 2018-08-14 — End: 2018-08-14

## 2018-08-14 MED ORDER — SENNOSIDES-DOCUSATE SODIUM 8.6-50 MG PO TAB
1 | Freq: Two times a day (BID) | ORAL | 0 refills | Status: DC
Start: 2018-08-14 — End: 2018-08-17

## 2018-08-14 MED ORDER — LOPERAMIDE 2 MG PO CAP
2 mg | ORAL | 0 refills | Status: DC | PRN
Start: 2018-08-14 — End: 2018-08-17
  Administered 2018-08-15 – 2018-08-17 (×3): 2 mg via ORAL

## 2018-08-14 MED ORDER — POLYETHYLENE GLYCOL 3350 17 GRAM PO PWPK
17 g | Freq: Every day | ORAL | 0 refills | Status: DC
Start: 2018-08-14 — End: 2018-08-17

## 2018-08-14 MED ORDER — CEFTRIAXONE INJ 1GM IVP
1 g | INTRAVENOUS | 0 refills | Status: DC
Start: 2018-08-14 — End: 2018-08-15
  Administered 2018-08-14 – 2018-08-15 (×2): 1 g via INTRAVENOUS

## 2018-08-14 MED ORDER — ENOXAPARIN 40 MG/0.4 ML SC SYRG
40 mg | Freq: Every day | SUBCUTANEOUS | 0 refills | Status: DC
Start: 2018-08-14 — End: 2018-08-17
  Administered 2018-08-14 – 2018-08-17 (×4): 40 mg via SUBCUTANEOUS

## 2018-08-14 MED ORDER — FLUCONAZOLE 200 MG PO TAB
200 mg | Freq: Once | ORAL | 0 refills | Status: CP
Start: 2018-08-14 — End: ?
  Administered 2018-08-14: 09:00:00 200 mg via ORAL

## 2018-08-14 MED ORDER — ONDANSETRON HCL (PF) 4 MG/2 ML IJ SOLN
4 mg | INTRAVENOUS | 0 refills | Status: DC | PRN
Start: 2018-08-14 — End: 2018-08-17
  Administered 2018-08-16 – 2018-08-17 (×2): 4 mg via INTRAVENOUS

## 2018-08-15 LAB — CBC: Lab: 10 10*3/uL (ref 4.5–11.0)

## 2018-08-15 LAB — BASIC METABOLIC PANEL
Lab: 117 mg/dL — ABNORMAL HIGH (ref 70–100)
Lab: 134 MMOL/L — ABNORMAL LOW (ref 137–147)
Lab: 21 MMOL/L (ref 21–30)
Lab: 7 pg (ref 3–12)
Lab: 7.8 mg/dL — ABNORMAL LOW (ref 8.5–10.6)
Lab: 8 mg/dL — ABNORMAL HIGH (ref 7–25)
Lab: >60 mL/min (ref >60)
Lab: >60 mL/min (ref >60)

## 2018-08-15 LAB — MAGNESIUM: Lab: 1.7 mg/dL — ABNORMAL LOW (ref 1.6–2.6)

## 2018-08-15 LAB — PHOSPHORUS: Lab: 2.3 mg/dL — ABNORMAL LOW (ref 2.0–4.5)

## 2018-08-15 MED ORDER — CEFTRIAXONE INJ 2GM IVP
2 g | INTRAVENOUS | 0 refills | Status: DC
Start: 2018-08-15 — End: 2018-08-16

## 2018-08-15 MED ORDER — MELATONIN 5 MG PO TAB
5 mg | Freq: Every evening | ORAL | 0 refills | Status: DC
Start: 2018-08-15 — End: 2018-08-17
  Administered 2018-08-15: 07:00:00 5 mg via ORAL

## 2018-08-15 MED ORDER — CEFTRIAXONE INJ 1GM IVP
1 g | Freq: Once | INTRAVENOUS | 0 refills | Status: CP
Start: 2018-08-15 — End: ?
  Administered 2018-08-15: 20:00:00 1 g via INTRAVENOUS

## 2018-08-16 LAB — CULTURE-BLOOD W/SENSITIVITY

## 2018-08-16 MED ORDER — ONDANSETRON HCL 8 MG PO TAB
4 mg | ORAL | 0 refills | Status: DC
Start: 2018-08-16 — End: 2018-08-16

## 2018-08-16 MED ORDER — LEVOFLOXACIN 750 MG PO TAB
750 mg | ORAL | 0 refills | Status: DC
Start: 2018-08-16 — End: 2018-08-17
  Administered 2018-08-16 – 2018-08-17 (×2): 750 mg via ORAL

## 2018-08-16 MED ORDER — ONDANSETRON HCL 8 MG PO TAB
4 mg | ORAL | 0 refills | Status: DC
Start: 2018-08-16 — End: 2018-08-17
  Administered 2018-08-17: 02:00:00 4 mg via ORAL

## 2018-08-17 ENCOUNTER — Encounter: Admit: 2018-08-17 | Discharge: 2018-08-17 | Payer: MEDICARE

## 2018-08-17 ENCOUNTER — Inpatient Hospital Stay
Admit: 2018-08-14 | Discharge: 2018-08-17 | Disposition: A | Discharge status: Home Health | Payer: MEDICARE | Attending: Student in an Organized Health Care Education/Training Program | Admitting: Student in an Organized Health Care Education/Training Program

## 2018-08-17 DIAGNOSIS — Z90710 Acquired absence of both cervix and uterus: ICD-10-CM

## 2018-08-17 DIAGNOSIS — I2510 Atherosclerotic heart disease of native coronary artery without angina pectoris: ICD-10-CM

## 2018-08-17 DIAGNOSIS — N390 Urinary tract infection, site not specified: ICD-10-CM

## 2018-08-17 DIAGNOSIS — T83511A Infection and inflammatory reaction due to indwelling urethral catheter, initial encounter: Principal | ICD-10-CM

## 2018-08-17 DIAGNOSIS — Z9049 Acquired absence of other specified parts of digestive tract: ICD-10-CM

## 2018-08-17 DIAGNOSIS — B9620 Unspecified Escherichia coli [E. coli] as the cause of diseases classified elsewhere: ICD-10-CM

## 2018-08-17 DIAGNOSIS — R5082 Postprocedural fever: Secondary | ICD-10-CM

## 2018-08-17 DIAGNOSIS — E8770 Fluid overload, unspecified: ICD-10-CM

## 2018-08-17 DIAGNOSIS — Z955 Presence of coronary angioplasty implant and graft: ICD-10-CM

## 2018-08-17 DIAGNOSIS — Z8541 Personal history of malignant neoplasm of cervix uteri: ICD-10-CM

## 2018-08-17 DIAGNOSIS — K219 Gastro-esophageal reflux disease without esophagitis: ICD-10-CM

## 2018-08-17 MED ORDER — METOCLOPRAMIDE HCL 5 MG PO TAB
5 mg | ORAL_TABLET | Freq: Four times a day (QID) | ORAL | 3 refills | Status: AC
Start: 2018-08-17 — End: 2018-10-14
  Filled 2018-08-17 (×2): qty 60, 15d supply, fill #1

## 2018-08-17 MED ORDER — LEVOFLOXACIN 750 MG PO TAB
750 mg | ORAL_TABLET | ORAL | 0 refills | Status: SS
Start: 2018-08-17 — End: 2018-09-24

## 2018-08-17 MED ORDER — ONDANSETRON HCL 4 MG PO TAB
4 mg | ORAL_TABLET | ORAL | 0 refills | Status: SS | PRN
Start: 2018-08-17 — End: 2018-10-14

## 2018-08-17 MED ORDER — METOCLOPRAMIDE HCL 5 MG/ML IJ SOLN
10 mg | INTRAVENOUS | 0 refills | Status: DC
Start: 2018-08-17 — End: 2018-08-17
  Administered 2018-08-17: 15:00:00 10 mg via INTRAVENOUS

## 2018-08-17 MED ORDER — LOPERAMIDE 2 MG PO CAP
ORAL_CAPSULE | ORAL | 0 refills | 10.00000 days | Status: AC
Start: 2018-08-17 — End: ?
  Filled 2018-08-17 (×2): qty 30, 4d supply, fill #1

## 2018-08-17 MED FILL — LEVOFLOXACIN 750 MG PO TAB: 750 mg | ORAL | 7 days supply | Qty: 7 | Fill #1 | Status: CP

## 2018-08-17 MED FILL — ONDANSETRON HCL 4 MG PO TAB: 4 mg | ORAL | 8 days supply | Qty: 30 | Fill #1 | Status: CP

## 2018-08-19 ENCOUNTER — Encounter: Admit: 2018-08-19 | Discharge: 2018-08-19 | Payer: MEDICARE

## 2018-08-20 LAB — CULTURE-BLOOD W/SENSITIVITY

## 2018-08-21 ENCOUNTER — Encounter: Admit: 2018-08-21 | Discharge: 2018-08-21 | Payer: MEDICARE

## 2018-08-22 ENCOUNTER — Encounter: Admit: 2018-08-22 | Discharge: 2018-08-22 | Payer: MEDICARE

## 2018-08-26 ENCOUNTER — Encounter: Admit: 2018-08-26 | Discharge: 2018-08-26 | Payer: MEDICARE

## 2018-08-27 ENCOUNTER — Encounter: Admit: 2018-08-27 | Discharge: 2018-08-27 | Payer: MEDICARE

## 2018-09-08 ENCOUNTER — Ambulatory Visit: Admit: 2018-09-08 | Discharge: 2018-09-22 | Payer: MEDICARE

## 2018-09-11 ENCOUNTER — Encounter: Admit: 2018-09-11 | Discharge: 2018-09-11 | Payer: MEDICARE

## 2018-09-11 ENCOUNTER — Ambulatory Visit: Admit: 2018-09-11 | Discharge: 2018-09-12 | Payer: MEDICARE

## 2018-09-11 DIAGNOSIS — Z9289 Personal history of other medical treatment: ICD-10-CM

## 2018-09-11 DIAGNOSIS — J90 Pleural effusion, not elsewhere classified: Principal | ICD-10-CM

## 2018-09-11 DIAGNOSIS — I208 Other forms of angina pectoris: ICD-10-CM

## 2018-09-11 DIAGNOSIS — I2510 Atherosclerotic heart disease of native coronary artery without angina pectoris: ICD-10-CM

## 2018-09-11 DIAGNOSIS — H547 Unspecified visual loss: ICD-10-CM

## 2018-09-11 DIAGNOSIS — C539 Malignant neoplasm of cervix uteri, unspecified: ICD-10-CM

## 2018-09-11 DIAGNOSIS — I6529 Occlusion and stenosis of unspecified carotid artery: ICD-10-CM

## 2018-09-11 DIAGNOSIS — Z955 Presence of coronary angioplasty implant and graft: ICD-10-CM

## 2018-09-11 DIAGNOSIS — R51 Headache: ICD-10-CM

## 2018-09-11 DIAGNOSIS — R918 Other nonspecific abnormal finding of lung field: ICD-10-CM

## 2018-09-11 DIAGNOSIS — E785 Hyperlipidemia, unspecified: Principal | ICD-10-CM

## 2018-09-11 DIAGNOSIS — H9190 Unspecified hearing loss, unspecified ear: ICD-10-CM

## 2018-09-11 DIAGNOSIS — R0602 Shortness of breath: ICD-10-CM

## 2018-09-11 DIAGNOSIS — M549 Dorsalgia, unspecified: ICD-10-CM

## 2018-09-11 DIAGNOSIS — R112 Nausea with vomiting, unspecified: ICD-10-CM

## 2018-09-11 DIAGNOSIS — I10 Essential (primary) hypertension: ICD-10-CM

## 2018-09-11 DIAGNOSIS — K219 Gastro-esophageal reflux disease without esophagitis: ICD-10-CM

## 2018-09-11 LAB — COMPREHENSIVE METABOLIC PANEL
Lab: 127 MMOL/L — ABNORMAL LOW (ref 137–147)
Lab: 3 MMOL/L — ABNORMAL LOW (ref 3.5–5.1)
Lab: 8 mg/dL — ABNORMAL LOW (ref 8.5–10.6)

## 2018-09-11 LAB — CBC AND DIFF
Lab: 10 10*3/uL (ref 4.5–11.0)
Lab: 3.3 M/UL — ABNORMAL LOW (ref 4.0–5.0)

## 2018-09-11 LAB — IRON + BINDING CAPACITY + %SAT+ FERRITIN
Lab: 170 ug/dL — ABNORMAL HIGH (ref 50–160)
Lab: 280 ug/dL — ABNORMAL HIGH (ref 270–380)
Lab: 386 ng/mL — ABNORMAL HIGH (ref 10–200)

## 2018-09-11 LAB — PREALBUMIN: Lab: 6 mg/dL — ABNORMAL LOW (ref 17–34)

## 2018-09-11 LAB — VITAMIN B12: Lab: 373 pg/mL — ABNORMAL HIGH (ref 180–914)

## 2018-09-11 MED ORDER — DRONABINOL 5 MG PO CAP
5 mg | ORAL_CAPSULE | Freq: Two times a day (BID) | ORAL | 0 refills | Status: AC
Start: 2018-09-11 — End: 2018-10-03

## 2018-09-12 ENCOUNTER — Encounter: Admit: 2018-09-12 | Discharge: 2018-09-12 | Payer: MEDICARE

## 2018-09-15 ENCOUNTER — Encounter: Admit: 2018-09-15 | Discharge: 2018-09-15 | Payer: MEDICARE

## 2018-09-16 ENCOUNTER — Encounter: Admit: 2018-09-16 | Discharge: 2018-09-16 | Payer: MEDICARE

## 2018-09-17 ENCOUNTER — Ambulatory Visit: Admit: 2018-09-17 | Discharge: 2018-09-17 | Payer: MEDICARE

## 2018-09-17 ENCOUNTER — Encounter: Admit: 2018-09-17 | Discharge: 2018-09-17 | Payer: MEDICARE

## 2018-09-17 DIAGNOSIS — E785 Hyperlipidemia, unspecified: Principal | ICD-10-CM

## 2018-09-17 DIAGNOSIS — I10 Essential (primary) hypertension: ICD-10-CM

## 2018-09-17 DIAGNOSIS — I2510 Atherosclerotic heart disease of native coronary artery without angina pectoris: ICD-10-CM

## 2018-09-17 DIAGNOSIS — I6529 Occlusion and stenosis of unspecified carotid artery: ICD-10-CM

## 2018-09-17 DIAGNOSIS — K219 Gastro-esophageal reflux disease without esophagitis: ICD-10-CM

## 2018-09-17 DIAGNOSIS — R51 Headache: ICD-10-CM

## 2018-09-17 DIAGNOSIS — Z955 Presence of coronary angioplasty implant and graft: ICD-10-CM

## 2018-09-17 DIAGNOSIS — R0602 Shortness of breath: ICD-10-CM

## 2018-09-17 DIAGNOSIS — I208 Other forms of angina pectoris: ICD-10-CM

## 2018-09-17 DIAGNOSIS — M549 Dorsalgia, unspecified: ICD-10-CM

## 2018-09-17 DIAGNOSIS — R112 Nausea with vomiting, unspecified: ICD-10-CM

## 2018-09-17 DIAGNOSIS — H9190 Unspecified hearing loss, unspecified ear: ICD-10-CM

## 2018-09-17 DIAGNOSIS — H547 Unspecified visual loss: ICD-10-CM

## 2018-09-17 DIAGNOSIS — Z9289 Personal history of other medical treatment: ICD-10-CM

## 2018-09-18 ENCOUNTER — Encounter: Admit: 2018-09-18 | Discharge: 2018-09-18 | Payer: MEDICARE

## 2018-09-22 ENCOUNTER — Encounter: Admit: 2018-09-22 | Discharge: 2018-09-22 | Payer: MEDICARE

## 2018-09-22 ENCOUNTER — Emergency Department: Admit: 2018-09-22 | Discharge: 2018-09-22 | Payer: MEDICARE | Attending: Gynecologic Oncology

## 2018-09-22 DIAGNOSIS — M549 Dorsalgia, unspecified: ICD-10-CM

## 2018-09-22 DIAGNOSIS — C539 Malignant neoplasm of cervix uteri, unspecified: Principal | ICD-10-CM

## 2018-09-22 DIAGNOSIS — R112 Nausea with vomiting, unspecified: ICD-10-CM

## 2018-09-22 DIAGNOSIS — R0602 Shortness of breath: ICD-10-CM

## 2018-09-22 DIAGNOSIS — I10 Essential (primary) hypertension: ICD-10-CM

## 2018-09-22 DIAGNOSIS — H9190 Unspecified hearing loss, unspecified ear: ICD-10-CM

## 2018-09-22 DIAGNOSIS — H547 Unspecified visual loss: ICD-10-CM

## 2018-09-22 DIAGNOSIS — I6529 Occlusion and stenosis of unspecified carotid artery: ICD-10-CM

## 2018-09-22 DIAGNOSIS — R51 Headache: ICD-10-CM

## 2018-09-22 DIAGNOSIS — I2510 Atherosclerotic heart disease of native coronary artery without angina pectoris: ICD-10-CM

## 2018-09-22 DIAGNOSIS — I208 Other forms of angina pectoris: ICD-10-CM

## 2018-09-22 DIAGNOSIS — E785 Hyperlipidemia, unspecified: Principal | ICD-10-CM

## 2018-09-22 DIAGNOSIS — Z955 Presence of coronary angioplasty implant and graft: ICD-10-CM

## 2018-09-22 DIAGNOSIS — F4321 Adjustment disorder with depressed mood: ICD-10-CM

## 2018-09-22 DIAGNOSIS — K219 Gastro-esophageal reflux disease without esophagitis: ICD-10-CM

## 2018-09-22 DIAGNOSIS — Z9289 Personal history of other medical treatment: ICD-10-CM

## 2018-09-22 MED ORDER — ONDANSETRON HCL (PF) 4 MG/2 ML IJ SOLN
4 mg | Freq: Once | INTRAVENOUS | 0 refills | Status: CP
Start: 2018-09-22 — End: ?

## 2018-09-22 MED ORDER — CEFTRIAXONE INJ 1GM IVP
1 g | Freq: Once | INTRAVENOUS | 0 refills | Status: CP
Start: 2018-09-22 — End: ?
  Administered 2018-09-23: 05:00:00 1 g via INTRAVENOUS

## 2018-09-23 ENCOUNTER — Inpatient Hospital Stay: Admit: 2018-09-23 | Discharge: 2018-09-23 | Payer: MEDICARE

## 2018-09-23 ENCOUNTER — Encounter: Admit: 2018-09-23 | Discharge: 2018-09-23 | Payer: MEDICARE

## 2018-09-23 DIAGNOSIS — R0602 Shortness of breath: ICD-10-CM

## 2018-09-23 LAB — URINALYSIS, MICROSCOPIC

## 2018-09-23 LAB — BNP POC ER: Lab: 182 pg/mL — ABNORMAL HIGH (ref 0–100)

## 2018-09-23 LAB — COMPREHENSIVE METABOLIC PANEL
Lab: 1.6 mg/dL — ABNORMAL HIGH (ref 0.4–1.00)
Lab: 110 mg/dL — ABNORMAL HIGH (ref 70–100)
Lab: 123 MMOL/L — ABNORMAL LOW (ref 137–147)
Lab: 22 mg/dL (ref 7–25)
Lab: 4.4 MMOL/L — ABNORMAL LOW (ref 3.5–5.1)
Lab: 90 MMOL/L — ABNORMAL LOW (ref 98–110)
Lab: 1.6 mg/dL — ABNORMAL HIGH (ref 0.4–1.00)
Lab: 1.8 mg/dL — ABNORMAL HIGH (ref 0.3–1.2)
Lab: 102 mg/dL — ABNORMAL HIGH (ref 70–100)
Lab: 126 MMOL/L — ABNORMAL LOW (ref 137–147)
Lab: 143 U/L — ABNORMAL HIGH (ref 25–110)
Lab: 2.5 g/dL — ABNORMAL LOW (ref 3.5–5.0)
Lab: 23 mg/dL (ref 7–25)
Lab: 27 MMOL/L (ref 21–30)
Lab: 30 mL/min — ABNORMAL LOW (ref >60)
Lab: 32 U/L (ref 7–40)
Lab: 36 mL/min — ABNORMAL LOW (ref >60)
Lab: 4.7 MMOL/L — ABNORMAL LOW (ref 3.5–5.1)
Lab: 6.2 g/dL (ref 6.0–8.0)
Lab: 8 (ref 3–12)
Lab: 8.1 mg/dL — ABNORMAL LOW (ref 8.5–10.6)
Lab: 9 U/L (ref 7–56)
Lab: 91 MMOL/L — ABNORMAL LOW (ref 98–110)

## 2018-09-23 LAB — CREATININE-URINE RANDOM: Lab: 105 mg/dL

## 2018-09-23 LAB — URINALYSIS DIPSTICK
Lab: NEGATIVE mL/min — ABNORMAL LOW
Lab: NEGATIVE

## 2018-09-23 LAB — UREA NITROGEN-URINE RANDOM: Lab: 578 mg/dL (ref 7–40)

## 2018-09-23 LAB — MAGNESIUM: Lab: 1.7 mg/dL (ref 1.6–2.6)

## 2018-09-23 LAB — GRAM STAIN

## 2018-09-23 LAB — CBC: Lab: 9.6 10*3/uL (ref 4.5–11.0)

## 2018-09-23 LAB — SODIUM-URINE RANDOM: Lab: 22 MMOL/L — ABNORMAL LOW (ref 25–110)

## 2018-09-23 LAB — PLEURAL FLUID ALBUMIN

## 2018-09-23 LAB — PROTIME INR (PT): Lab: 1.1 (ref 0.8–1.2)

## 2018-09-23 LAB — LACTIC ACID(LACTATE): Lab: 1.5 MMOL/L (ref 0.5–2.0)

## 2018-09-23 LAB — TROPONIN-I

## 2018-09-23 LAB — PERITONEAL FLUID ALBUMIN

## 2018-09-23 LAB — POC CREATININE, RAD: Lab: 1.6 mg/dL — ABNORMAL HIGH (ref >40)

## 2018-09-23 LAB — PLEURAL FLUID TOTAL PROTEIN: Lab: 2.3 g/dL — ABNORMAL HIGH (ref <1.1)

## 2018-09-23 LAB — OSMOLALITY-URINE RANDOM: Lab: 396 mosm/kg — ABNORMAL LOW (ref 50–1400)

## 2018-09-23 LAB — POC TROPONIN

## 2018-09-23 LAB — POC LACTATE: Lab: 2.1 MMOL/L — ABNORMAL HIGH (ref 0.5–2.0)

## 2018-09-23 LAB — CBC AND DIFF: Lab: 7.4 10*3/uL (ref 4.5–11.0)

## 2018-09-23 LAB — PHOSPHORUS: Lab: 3.4 mg/dL (ref 2.0–4.5)

## 2018-09-23 LAB — OSMOLALITY: Lab: 270 mosm/kg — ABNORMAL LOW (ref 280–307)

## 2018-09-23 MED ORDER — BANANA BAG (~~LOC~~) 1000ML
Freq: Once | INTRAVENOUS | 0 refills | Status: CP
Start: 2018-09-23 — End: ?
  Administered 2018-09-23 (×4): 1001.200 mL via INTRAVENOUS

## 2018-09-23 MED ORDER — ALBUMIN, HUMAN 25 % IV SOLP
0 refills | Status: CP
  Administered 2018-09-23 (×2): 12.5 g via INTRAVENOUS

## 2018-09-23 MED ORDER — SODIUM CHLORIDE 0.9 % IV SOLP
INTRAVENOUS | 0 refills | Status: DC
Start: 2018-09-23 — End: 2018-09-23
  Administered 2018-09-23: 10:00:00 1000.000 mL via INTRAVENOUS

## 2018-09-23 MED ORDER — ALBUMIN, HUMAN 25 % IV SOLP
12.5 g | Freq: Once | INTRAVENOUS | 0 refills | Status: CP
Start: 2018-09-23 — End: ?
  Administered 2018-09-24: 02:00:00 12.5 g via INTRAVENOUS

## 2018-09-23 MED ORDER — DRONABINOL 2.5 MG PO CAP
5 mg | Freq: Two times a day (BID) | ORAL | 0 refills | Status: DC
Start: 2018-09-23 — End: 2018-09-23

## 2018-09-23 MED ORDER — ENOXAPARIN 30 MG/0.3 ML SC SYRG
30 mg | Freq: Every day | SUBCUTANEOUS | 0 refills | Status: DC
Start: 2018-09-23 — End: 2018-10-02
  Administered 2018-09-24 – 2018-10-02 (×9): 30 mg via SUBCUTANEOUS

## 2018-09-23 MED ORDER — ZOLPIDEM 5 MG PO TAB
10 mg | Freq: Every evening | ORAL | 0 refills | Status: DC
Start: 2018-09-23 — End: 2018-09-23

## 2018-09-23 MED ORDER — MEGESTROL 400 MG/10 ML (40 MG/ML) PO SUSP
600 mg | Freq: Every day | ORAL | 0 refills | Status: DC
Start: 2018-09-23 — End: 2018-09-25
  Administered 2018-09-24 (×2): 600 mg via ORAL

## 2018-09-23 MED ORDER — PANTOPRAZOLE 40 MG PO TBEC
40 mg | Freq: Two times a day (BID) | ORAL | 0 refills | Status: DC
Start: 2018-09-23 — End: 2018-10-03
  Administered 2018-09-24 – 2018-10-03 (×19): 40 mg via ORAL

## 2018-09-23 MED ORDER — METOPROLOL SUCCINATE 50 MG PO TB24
50 mg | Freq: Every evening | ORAL | 0 refills | Status: DC
Start: 2018-09-23 — End: 2018-10-03
  Administered 2018-09-24 – 2018-10-03 (×6): 50 mg via ORAL

## 2018-09-23 MED ORDER — ASPIRIN 81 MG PO TBEC
81 mg | Freq: Every evening | ORAL | 0 refills | Status: DC
Start: 2018-09-23 — End: 2018-09-23

## 2018-09-23 MED ORDER — CHOLESTYRAMINE-ASPARTAME 4 GRAM PO PWPK
4 g | Freq: Three times a day (TID) | ORAL | 0 refills | Status: DC
Start: 2018-09-23 — End: 2018-09-25
  Administered 2018-09-24: 4 g via ORAL

## 2018-09-23 MED ORDER — PERFLUTREN LIPID MICROSPHERES 1.1 MG/ML IV SUSP
1-20 mL | Freq: Once | INTRAVENOUS | 0 refills | Status: CP | PRN
Start: 2018-09-23 — End: ?
  Administered 2018-09-23: 20:00:00 2 mL via INTRAVENOUS

## 2018-09-23 MED ORDER — ROSUVASTATIN 20 MG PO TAB
20 mg | Freq: Every evening | ORAL | 0 refills | Status: DC
Start: 2018-09-23 — End: 2018-09-23

## 2018-09-23 MED ADMIN — ONDANSETRON HCL (PF) 4 MG/2 ML IJ SOLN [136012]: 4 mg | INTRAVENOUS | @ 05:00:00 | Stop: 2018-09-23 | NDC 00641607801

## 2018-09-24 LAB — URIC ACID: Lab: 7.8 mg/dL — ABNORMAL HIGH (ref 2.0–7.0)

## 2018-09-24 LAB — CBC
Lab: 100 FL — ABNORMAL HIGH (ref 80–100)
Lab: 158 K/UL — ABNORMAL LOW (ref >60)
Lab: 19 % — ABNORMAL HIGH (ref 11–15)
Lab: 2.3 M/UL — ABNORMAL LOW (ref 4.0–5.0)
Lab: 23 % — ABNORMAL LOW (ref 36–45)
Lab: 34 g/dL — ABNORMAL HIGH (ref 32.0–36.0)
Lab: 34 pg — ABNORMAL HIGH (ref 26–34)
Lab: 8 g/dL — ABNORMAL LOW (ref 12.0–15.0)
Lab: 8.1 FL — ABNORMAL LOW (ref >60)
Lab: 8.8 10*3/uL (ref 4.5–11.0)

## 2018-09-24 LAB — GRAM STAIN

## 2018-09-24 LAB — CULTURE-URINE W/SENSITIVITY: Lab: <10

## 2018-09-24 LAB — CELL COUNT W/DIFF-FLUIDS
Lab: 540 /uL
Lab: 960 /uL

## 2018-09-24 LAB — OSMOLALITY-URINE RANDOM: Lab: 403 mosm/kg (ref 50–1400)

## 2018-09-24 LAB — BASIC METABOLIC PANEL: Lab: 129 MMOL/L — ABNORMAL LOW (ref 137–147)

## 2018-09-24 MED ORDER — ACETAMINOPHEN 325 MG PO TAB
650 mg | ORAL | 0 refills | Status: DC | PRN
Start: 2018-09-24 — End: 2018-10-03
  Administered 2018-09-26 – 2018-10-03 (×5): 650 mg via ORAL

## 2018-09-24 MED ORDER — FUROSEMIDE 20 MG PO TAB
80 mg | Freq: Every day | ORAL | 0 refills | Status: DC
Start: 2018-09-24 — End: 2018-09-26
  Administered 2018-09-24 – 2018-09-25 (×2): 80 mg via ORAL

## 2018-09-24 MED ORDER — MULTIVIT-IRON-FA-CALCIUM-MINS 9 MG IRON-400 MCG PO TAB
1 | Freq: Every day | ORAL | 0 refills | Status: DC
Start: 2018-09-24 — End: 2018-09-29
  Administered 2018-09-24 – 2018-09-29 (×6): 1 via ORAL

## 2018-09-25 LAB — IRON + BINDING CAPACITY + %SAT+ FERRITIN
Lab: 17 ug/dL — ABNORMAL LOW (ref 50–160)
Lab: 176 ug/dL — ABNORMAL LOW (ref 270–380)

## 2018-09-25 LAB — LIPID PROFILE
Lab: 16 mg/dL (ref <100)
Lab: 38 mg/dL
Lab: 44 mg/dL — ABNORMAL LOW (ref <200)
Lab: 99 mg/dL — ABNORMAL HIGH (ref <150)

## 2018-09-25 LAB — 25-OH VITAMIN D (D2 + D3): Lab: 8.6 ng/mL — ABNORMAL LOW (ref 30–80)

## 2018-09-25 LAB — FOLATE, SERUM: Lab: 8.8 ng/mL (ref >3.9)

## 2018-09-25 LAB — BASIC METABOLIC PANEL: Lab: 127 MMOL/L — ABNORMAL LOW (ref 137–147)

## 2018-09-25 LAB — PREALBUMIN

## 2018-09-25 LAB — CBC: Lab: 9.4 K/UL — ABNORMAL HIGH (ref 4.5–11.0)

## 2018-09-25 LAB — C REACTIVE PROTEIN (CRP): Lab: 15 mg/dL — ABNORMAL HIGH (ref <1.0)

## 2018-09-25 LAB — VITAMIN B12: Lab: 450 pg/mL (ref 180–914)

## 2018-09-25 MED ORDER — FERRIC GLUCONATE 125 MG IN NS 100 ML IVPB
125 mg | Freq: Every day | INTRAVENOUS | 0 refills | Status: CP
Start: 2018-09-25 — End: ?
  Administered 2018-09-25 – 2018-09-27 (×6): 125 mg via INTRAVENOUS

## 2018-09-25 MED ORDER — MEGESTROL 400 MG/10 ML (40 MG/ML) PO SUSP
600 mg | Freq: Every day | ORAL | 0 refills | Status: DC
Start: 2018-09-25 — End: 2018-09-30
  Administered 2018-09-25 – 2018-09-29 (×5): 600 mg via ORAL

## 2018-09-25 MED ORDER — DIPHENHYDRAMINE HCL 50 MG/ML IJ SOLN
25 mg | INTRAVENOUS | 0 refills | Status: DC | PRN
Start: 2018-09-25 — End: 2018-10-03

## 2018-09-25 MED ORDER — EPINEPHRINE HCL (PF) 1 MG/ML (1 ML) IJ SOLN
.3-.5 mg | INTRAMUSCULAR | 0 refills | Status: DC | PRN
Start: 2018-09-25 — End: 2018-10-03

## 2018-09-25 MED ORDER — CHOLECALCIFEROL (VITAMIN D3) 400 UNIT PO TAB
1200 [IU] | Freq: Three times a day (TID) | ORAL | 0 refills | Status: DC
Start: 2018-09-25 — End: 2018-09-26
  Administered 2018-09-26: 01:00:00 1200 [IU] via ORAL

## 2018-09-25 MED ORDER — BANANA BAG (~~LOC~~) 1000ML
Freq: Once | INTRAVENOUS | 0 refills | Status: CP
Start: 2018-09-25 — End: ?
  Administered 2018-09-26 (×4): 1001.200 mL via INTRAVENOUS

## 2018-09-26 ENCOUNTER — Encounter: Admit: 2018-09-26 | Discharge: 2018-09-26 | Payer: MEDICARE

## 2018-09-26 LAB — ZINC: Lab: 0.4 — ABNORMAL LOW

## 2018-09-26 LAB — CBC
Lab: 2.4 M/UL — ABNORMAL LOW (ref 4.0–5.0)
Lab: 24 % — ABNORMAL LOW (ref 36–45)
Lab: 8.2 g/dL — ABNORMAL LOW (ref 12.0–15.0)
Lab: 8.8 10*3/uL (ref 4.5–11.0)

## 2018-09-26 LAB — BASIC METABOLIC PANEL: Lab: 127 MMOL/L — ABNORMAL LOW (ref >60)

## 2018-09-26 LAB — UREA NITROGEN-URINE RANDOM: Lab: 249 mg/dL — ABNORMAL LOW (ref 24–44)

## 2018-09-26 LAB — SELENIUM: Lab: 83 mg/dL

## 2018-09-26 LAB — CREATININE-URINE RANDOM: Lab: 92 mg/dL (ref 4–12)

## 2018-09-26 LAB — SODIUM-URINE RANDOM: Lab: 56 MMOL/L (ref 41–77)

## 2018-09-26 LAB — OSMOLALITY-URINE RANDOM: Lab: 307 mosm/kg (ref 50–1400)

## 2018-09-26 MED ORDER — POTASSIUM, SODIUM PHOSPHATES 280-160-250 MG PO PWPK
4 | Freq: Once | ORAL | 0 refills | Status: CP
Start: 2018-09-26 — End: ?
  Administered 2018-09-26: 13:00:00 1000 mg via ORAL

## 2018-09-26 MED ORDER — ERGOCALCIFEROL (VITAMIN D2) 50,000 UNIT PO CAP
50000 [IU] | ORAL | 0 refills | Status: DC
Start: 2018-09-26 — End: 2018-10-03
  Administered 2018-09-26 – 2018-10-03 (×2): 50000 [IU] via ORAL

## 2018-09-26 MED ORDER — ZINC SULFATE 220 (50) MG PO CAP
220 mg | Freq: Every day | ORAL | 0 refills | Status: DC
Start: 2018-09-26 — End: 2018-09-29
  Administered 2018-09-27 – 2018-09-29 (×4): 220 mg via ORAL

## 2018-09-27 LAB — CBC: Lab: 9.6 K/UL — ABNORMAL LOW (ref 4.5–11.0)

## 2018-09-27 LAB — BASIC METABOLIC PANEL: Lab: 129 MMOL/L — ABNORMAL LOW (ref 137–147)

## 2018-09-27 MED ORDER — MIDAZOLAM 1 MG/ML IJ SOLN
0 refills | Status: CP
Start: 2018-09-27 — End: ?
  Administered 2018-09-27: 14:00:00 1 mg via INTRAVENOUS
  Administered 2018-09-27: 14:00:00 0.5 mg via INTRAVENOUS

## 2018-09-27 MED ORDER — POTASSIUM, SODIUM PHOSPHATES 280-160-250 MG PO PWPK
4 | Freq: Once | ORAL | 0 refills | Status: CP
Start: 2018-09-27 — End: ?
  Administered 2018-09-27: 20:00:00 1000 mg via ORAL

## 2018-09-27 MED ORDER — FENTANYL CITRATE (PF) 50 MCG/ML IJ SOLN
0 refills | Status: CP
Start: 2018-09-27 — End: ?
  Administered 2018-09-27: 15:00:00 25 ug via INTRAVENOUS
  Administered 2018-09-27: 14:00:00 50 ug via INTRAVENOUS

## 2018-09-27 MED ORDER — FENTANYL CITRATE (PF) 50 MCG/ML IJ SOLN
25-50 ug | Freq: Once | INTRAVENOUS | 0 refills | Status: AC
Start: 2018-09-27 — End: ?

## 2018-09-28 LAB — CULTURE-WOUND/TISSUE/FLUID(AEROBIC ONLY)W/SENSITIVITY

## 2018-09-28 LAB — BASIC METABOLIC PANEL
Lab: 129 MMOL/L — ABNORMAL LOW (ref 137–147)
Lab: 3.3 MMOL/L — ABNORMAL LOW (ref >60)

## 2018-09-28 LAB — MAGNESIUM: Lab: 1.7 mg/dL (ref 1.6–2.6)

## 2018-09-28 LAB — CULTURE-BLOOD W/SENSITIVITY

## 2018-09-28 LAB — PHOSPHORUS: Lab: 5.7 mg/dL — ABNORMAL HIGH (ref 2.0–4.5)

## 2018-09-28 LAB — CBC
Lab: 2.3 M/UL — ABNORMAL LOW (ref 4.0–5.0)
Lab: 8.1 K/UL — ABNORMAL LOW (ref >60)

## 2018-09-28 MED ORDER — MAGNESIUM SULFATE IN D5W 1 GRAM/100 ML IV PGBK
1 g | Freq: Once | INTRAVENOUS | 0 refills | Status: CP
Start: 2018-09-28 — End: ?
  Administered 2018-09-28: 11:00:00 1 g via INTRAVENOUS

## 2018-09-28 MED ORDER — ADULT PN CONTINUOUS-ION BASED
INTRAVENOUS | 0 refills | Status: CP
Start: 2018-09-28 — End: ?
  Administered 2018-09-29: 02:00:00 1200.000 mL via INTRAVENOUS

## 2018-09-28 MED ORDER — POTASSIUM CHLORIDE IN WATER 10 MEQ/50 ML IV PGBK
10 meq | INTRAVENOUS | 0 refills | Status: CP
Start: 2018-09-28 — End: ?
  Administered 2018-09-28 (×5): 10 meq via INTRAVENOUS

## 2018-09-28 MED ORDER — POTASSIUM, SODIUM PHOSPHATES 280-160-250 MG PO PWPK
4 | Freq: Once | ORAL | 0 refills | Status: DC
Start: 2018-09-28 — End: 2018-09-28

## 2018-09-28 MED ORDER — LOPERAMIDE 2 MG PO CAP
2 mg | Freq: Three times a day (TID) | ORAL | 0 refills | Status: DC | PRN
Start: 2018-09-28 — End: 2018-10-03
  Administered 2018-09-28: 17:00:00 2 mg via ORAL

## 2018-09-28 MED ORDER — POTASSIUM CHLORIDE IN WATER 10 MEQ/50 ML IV PGBK
10 meq | INTRAVENOUS | 0 refills | Status: DC
Start: 2018-09-28 — End: 2018-09-28

## 2018-09-29 ENCOUNTER — Encounter: Admit: 2018-09-29 | Discharge: 2018-09-29 | Payer: MEDICARE

## 2018-09-29 LAB — MAGNESIUM: Lab: 2 mg/dL — ABNORMAL LOW (ref >60)

## 2018-09-29 LAB — PHOSPHORUS: Lab: 4 mg/dL — ABNORMAL LOW (ref 2.0–4.5)

## 2018-09-29 LAB — TRIGLYCERIDE: Lab: 127 mg/dL — ABNORMAL LOW (ref <150)

## 2018-09-29 LAB — POC GLUCOSE: Lab: 199 mg/dL — ABNORMAL HIGH (ref 70–100)

## 2018-09-29 LAB — IONIZED CALCIUM: Lab: 1 MMOL/L — ABNORMAL LOW (ref >60)

## 2018-09-29 LAB — COMPREHENSIVE METABOLIC PANEL: Lab: 127 MMOL/L — ABNORMAL LOW (ref >60)

## 2018-09-29 MED ORDER — MIDODRINE 5 MG PO TAB
5 mg | ORAL | 0 refills | Status: DC
Start: 2018-09-29 — End: 2018-10-03
  Administered 2018-09-29 – 2018-10-03 (×12): 5 mg via ORAL

## 2018-09-29 MED ORDER — ADULT PN CONTINUOUS-ION BASED
INTRAVENOUS | 0 refills | Status: CP
Start: 2018-09-29 — End: ?
  Administered 2018-09-30: 02:00:00 1320.000 mL via INTRAVENOUS

## 2018-09-29 MED ORDER — ONDANSETRON HCL (PF) 4 MG/2 ML IJ SOLN
4 mg | Freq: Once | INTRAVENOUS | 0 refills | Status: CP
Start: 2018-09-29 — End: ?
  Administered 2018-09-29: 15:00:00 4 mg via INTRAVENOUS

## 2018-09-30 ENCOUNTER — Inpatient Hospital Stay: Admit: 2018-09-30 | Discharge: 2018-09-30 | Payer: MEDICARE

## 2018-09-30 ENCOUNTER — Encounter: Admit: 2018-09-30 | Discharge: 2018-09-30 | Payer: MEDICARE

## 2018-09-30 DIAGNOSIS — R0602 Shortness of breath: ICD-10-CM

## 2018-09-30 DIAGNOSIS — Z955 Presence of coronary angioplasty implant and graft: ICD-10-CM

## 2018-09-30 DIAGNOSIS — K219 Gastro-esophageal reflux disease without esophagitis: ICD-10-CM

## 2018-09-30 DIAGNOSIS — I10 Essential (primary) hypertension: ICD-10-CM

## 2018-09-30 DIAGNOSIS — R51 Headache: ICD-10-CM

## 2018-09-30 DIAGNOSIS — I6529 Occlusion and stenosis of unspecified carotid artery: ICD-10-CM

## 2018-09-30 DIAGNOSIS — H9190 Unspecified hearing loss, unspecified ear: ICD-10-CM

## 2018-09-30 DIAGNOSIS — I2510 Atherosclerotic heart disease of native coronary artery without angina pectoris: ICD-10-CM

## 2018-09-30 DIAGNOSIS — R112 Nausea with vomiting, unspecified: ICD-10-CM

## 2018-09-30 DIAGNOSIS — I208 Other forms of angina pectoris: ICD-10-CM

## 2018-09-30 DIAGNOSIS — M549 Dorsalgia, unspecified: ICD-10-CM

## 2018-09-30 DIAGNOSIS — H547 Unspecified visual loss: ICD-10-CM

## 2018-09-30 DIAGNOSIS — Z9289 Personal history of other medical treatment: ICD-10-CM

## 2018-09-30 DIAGNOSIS — E785 Hyperlipidemia, unspecified: Principal | ICD-10-CM

## 2018-09-30 LAB — BASIC METABOLIC PANEL
Lab: 124 MMOL/L — ABNORMAL LOW (ref 137–147)
Lab: 1.8 mg/dL — ABNORMAL HIGH (ref 0.4–1.00)
Lab: 120 mg/dL — ABNORMAL HIGH (ref 70–100)
Lab: 128 MMOL/L — ABNORMAL LOW (ref 137–147)
Lab: 26 mL/min — ABNORMAL LOW (ref >60)
Lab: 3.9 MMOL/L (ref 3.5–5.1)
Lab: 32 mL/min — ABNORMAL LOW (ref >60)
Lab: 59 mg/dL — ABNORMAL HIGH (ref 7–25)
Lab: 7.8 mg/dL — ABNORMAL LOW (ref 8.5–10.6)
Lab: 9 (ref 3–12)

## 2018-09-30 LAB — PHOSPHORUS
Lab: 3.6 mg/dL — ABNORMAL LOW (ref 2.0–4.5)
Lab: 3.2 mg/dL (ref 2.0–4.5)

## 2018-09-30 LAB — MAGNESIUM
Lab: 2.7 mg/dL — ABNORMAL HIGH (ref 1.6–2.6)
Lab: 2.4 mg/dL — ABNORMAL LOW (ref 1.6–2.6)

## 2018-09-30 LAB — POC GLUCOSE
Lab: 148 mg/dL — ABNORMAL HIGH (ref 70–100)
Lab: 135 mg/dL — ABNORMAL HIGH (ref 70–100)
Lab: 292 mg/dL — ABNORMAL HIGH (ref 70–100)

## 2018-09-30 MED ORDER — ALBUMIN, HUMAN 25 % IV SOLP
25 g | INTRAVENOUS | 0 refills | Status: CP
Start: 2018-09-30 — End: ?
  Administered 2018-09-30 – 2018-10-01 (×2): 25 g via INTRAVENOUS

## 2018-09-30 MED ORDER — FLUCONAZOLE 200 MG PO TAB
200 mg | Freq: Every day | ORAL | 0 refills | Status: DC
Start: 2018-09-30 — End: 2018-10-03
  Administered 2018-09-30 – 2018-10-03 (×4): 200 mg via ORAL

## 2018-09-30 MED ORDER — NITROFURANTOIN MONOHYD/M-CRYST 100 MG PO CAP
100 mg | Freq: Two times a day (BID) | ORAL | 0 refills | Status: DC
Start: 2018-09-30 — End: 2018-10-01
  Administered 2018-09-30 – 2018-10-01 (×3): 100 mg via ORAL

## 2018-09-30 MED ORDER — MEGESTROL 400 MG/10 ML (40 MG/ML) PO SUSP
800 mg | Freq: Every day | ORAL | 0 refills | Status: DC
Start: 2018-09-30 — End: 2018-10-03
  Administered 2018-09-30 – 2018-10-03 (×4): 800 mg via ORAL

## 2018-09-30 MED ORDER — ADULT PN CONTINUOUS-ION BASED
INTRAVENOUS | 0 refills | Status: CP
Start: 2018-09-30 — End: ?
  Administered 2018-10-01: 02:00:00 1560.000 mL via INTRAVENOUS

## 2018-09-30 MED ORDER — MEGESTROL 400 MG/10 ML (40 MG/ML) PO SUSP
600 mg | Freq: Two times a day (BID) | ORAL | 0 refills | Status: DC
Start: 2018-09-30 — End: 2018-09-30

## 2018-09-30 MED ORDER — FUROSEMIDE 10 MG/ML IJ SOLN
40 mg | Freq: Once | INTRAVENOUS | 0 refills | Status: CP
Start: 2018-09-30 — End: ?
  Administered 2018-09-30: 23:00:00 40 mg via INTRAVENOUS

## 2018-10-01 LAB — CULTURE-URINE W/SENSITIVITY
Lab: <10
Lab: >10 — AB

## 2018-10-01 LAB — BASIC METABOLIC PANEL: Lab: 133 MMOL/L — ABNORMAL LOW (ref >60)

## 2018-10-01 LAB — POC GLUCOSE
Lab: 128 mg/dL — ABNORMAL HIGH (ref 70–100)
Lab: 163 mg/dL — ABNORMAL HIGH (ref 70–100)
Lab: 204 mg/dL — ABNORMAL HIGH (ref 70–100)
Lab: 119 mg/dL — ABNORMAL HIGH (ref 70–100)

## 2018-10-01 LAB — GRAM STAIN

## 2018-10-01 LAB — PHOSPHORUS: Lab: 2.9 mg/dL — ABNORMAL HIGH (ref 2.0–4.5)

## 2018-10-01 LAB — MAGNESIUM: Lab: 2.2 mg/dL — ABNORMAL HIGH (ref >60)

## 2018-10-01 MED ORDER — ADULT PN CONTINUOUS-ION BASED
INTRAVENOUS | 0 refills | Status: CP
Start: 2018-10-01 — End: ?
  Administered 2018-10-02: 01:00:00 1320.000 mL via INTRAVENOUS

## 2018-10-01 MED ORDER — MIRTAZAPINE 7.5 MG PO TAB
7.5 mg | Freq: Every evening | ORAL | 0 refills | Status: DC
Start: 2018-10-01 — End: 2018-10-03
  Administered 2018-10-02 – 2018-10-03 (×2): 7.5 mg via ORAL

## 2018-10-01 MED ORDER — AMOXICILLIN 500 MG PO CAP
1000 mg | Freq: Two times a day (BID) | ORAL | 0 refills | Status: DC
Start: 2018-10-01 — End: 2018-10-03
  Administered 2018-10-01 – 2018-10-03 (×5): 1000 mg via ORAL

## 2018-10-01 MED ORDER — ALBUMIN, HUMAN 25 % IV SOLP
25 g | 0 refills | Status: CP
Start: 2018-10-01 — End: ?
  Administered 2018-10-01 (×2): 25 g

## 2018-10-01 MED ORDER — CYCLOBENZAPRINE 10 MG PO TAB
10 mg | Freq: Once | ORAL | 0 refills | Status: CP
Start: 2018-10-01 — End: ?
  Administered 2018-10-01: 20:00:00 10 mg via ORAL

## 2018-10-01 MED ORDER — FUROSEMIDE 10 MG/ML IJ SOLN
80 mg | INTRAVENOUS | 0 refills | Status: CP
Start: 2018-10-01 — End: ?
  Administered 2018-10-02 (×2): 80 mg via INTRAVENOUS

## 2018-10-02 ENCOUNTER — Inpatient Hospital Stay: Admit: 2018-10-10 | Discharge: 2018-10-10 | Payer: MEDICARE

## 2018-10-02 LAB — MAGNESIUM: Lab: 2.1 mg/dL — ABNORMAL LOW (ref >60)

## 2018-10-02 LAB — COMPREHENSIVE METABOLIC PANEL: Lab: 130 MMOL/L — ABNORMAL LOW (ref 137–147)

## 2018-10-02 LAB — POC GLUCOSE
Lab: 135 mg/dL — ABNORMAL HIGH (ref 70–100)
Lab: 146 mg/dL — ABNORMAL HIGH (ref 70–100)
Lab: 159 mg/dL — ABNORMAL HIGH (ref 70–100)
Lab: 186 mg/dL — ABNORMAL HIGH (ref 70–100)
Lab: 317 mg/dL — ABNORMAL HIGH (ref 70–100)

## 2018-10-02 LAB — PHOSPHORUS: Lab: 3 mg/dL — ABNORMAL LOW (ref >60)

## 2018-10-02 LAB — TRIGLYCERIDE: Lab: 135 mg/dL — ABNORMAL LOW (ref <150)

## 2018-10-02 MED ORDER — ADULT PN CYCLIC-ION BASED
INTRAVENOUS | 0 refills | Status: CP
Start: 2018-10-02 — End: ?
  Administered 2018-10-03: 02:00:00 1320.000 mL via INTRAVENOUS

## 2018-10-02 MED ORDER — ALBUMIN, HUMAN 25 % IV SOLP
25 g | INTRAVENOUS | 0 refills | Status: CP
Start: 2018-10-02 — End: ?
  Administered 2018-10-02 (×2): 25 g via INTRAVENOUS

## 2018-10-02 MED ORDER — ENOXAPARIN 40 MG/0.4 ML SC SYRG
40 mg | Freq: Every day | SUBCUTANEOUS | 0 refills | Status: DC
Start: 2018-10-02 — End: 2018-10-03
  Administered 2018-10-03: 02:00:00 40 mg via SUBCUTANEOUS

## 2018-10-02 MED ORDER — FUROSEMIDE 10 MG/ML IJ SOLN
80 mg | Freq: Once | INTRAVENOUS | 0 refills | Status: CP
Start: 2018-10-02 — End: ?
  Administered 2018-10-02: 23:00:00 80 mg via INTRAVENOUS

## 2018-10-02 MED ORDER — ALBUMIN, HUMAN 25 % IV SOLP
25 g | INTRAVENOUS | 0 refills | Status: CP
Start: 2018-10-02 — End: ?
  Administered 2018-10-02 – 2018-10-03 (×2): 25 g via INTRAVENOUS

## 2018-10-02 MED ORDER — FUROSEMIDE 10 MG/ML IJ SOLN
80 mg | Freq: Once | INTRAVENOUS | 0 refills | Status: CP
Start: 2018-10-02 — End: ?
  Administered 2018-10-02: 19:00:00 80 mg via INTRAVENOUS

## 2018-10-03 ENCOUNTER — Inpatient Hospital Stay: Admit: 2018-09-30 | Discharge: 2018-09-30 | Payer: MEDICARE

## 2018-10-03 ENCOUNTER — Inpatient Hospital Stay: Admit: 2018-09-23 | Discharge: 2018-09-23 | Payer: MEDICARE

## 2018-10-03 ENCOUNTER — Inpatient Hospital Stay
Admit: 2018-09-22 | Discharge: 2018-10-03 | Disposition: A | Discharge status: Rehabilitation Facility | Payer: MEDICARE | Attending: Gynecologic Oncology | Admitting: Gynecologic Oncology

## 2018-10-03 ENCOUNTER — Emergency Department: Admit: 2018-09-22 | Discharge: 2018-09-22 | Payer: MEDICARE | Attending: Gynecologic Oncology

## 2018-10-03 ENCOUNTER — Inpatient Hospital Stay: Admit: 2018-09-27 | Discharge: 2018-09-27 | Payer: MEDICARE

## 2018-10-03 DIAGNOSIS — C539 Malignant neoplasm of cervix uteri, unspecified: ICD-10-CM

## 2018-10-03 DIAGNOSIS — R000 Tachycardia, unspecified: ICD-10-CM

## 2018-10-03 DIAGNOSIS — R630 Anorexia: ICD-10-CM

## 2018-10-03 DIAGNOSIS — B952 Enterococcus as the cause of diseases classified elsewhere: ICD-10-CM

## 2018-10-03 DIAGNOSIS — Z9049 Acquired absence of other specified parts of digestive tract: ICD-10-CM

## 2018-10-03 DIAGNOSIS — B3749 Other urogenital candidiasis: ICD-10-CM

## 2018-10-03 DIAGNOSIS — J90 Pleural effusion, not elsewhere classified: Principal | ICD-10-CM

## 2018-10-03 DIAGNOSIS — I10 Essential (primary) hypertension: ICD-10-CM

## 2018-10-03 DIAGNOSIS — I959 Hypotension, unspecified: ICD-10-CM

## 2018-10-03 DIAGNOSIS — I2510 Atherosclerotic heart disease of native coronary artery without angina pectoris: ICD-10-CM

## 2018-10-03 DIAGNOSIS — Z8542 Personal history of malignant neoplasm of other parts of uterus: ICD-10-CM

## 2018-10-03 DIAGNOSIS — Z955 Presence of coronary angioplasty implant and graft: ICD-10-CM

## 2018-10-03 DIAGNOSIS — G35 Multiple sclerosis: ICD-10-CM

## 2018-10-03 DIAGNOSIS — E876 Hypokalemia: ICD-10-CM

## 2018-10-03 DIAGNOSIS — E872 Acidosis: ICD-10-CM

## 2018-10-03 DIAGNOSIS — Z6829 Body mass index (BMI) 29.0-29.9, adult: ICD-10-CM

## 2018-10-03 DIAGNOSIS — E785 Hyperlipidemia, unspecified: ICD-10-CM

## 2018-10-03 DIAGNOSIS — E871 Hypo-osmolality and hyponatremia: ICD-10-CM

## 2018-10-03 DIAGNOSIS — K219 Gastro-esophageal reflux disease without esophagitis: ICD-10-CM

## 2018-10-03 DIAGNOSIS — Z90710 Acquired absence of both cervix and uterus: ICD-10-CM

## 2018-10-03 DIAGNOSIS — D509 Iron deficiency anemia, unspecified: ICD-10-CM

## 2018-10-03 DIAGNOSIS — E8809 Other disorders of plasma-protein metabolism, not elsewhere classified: ICD-10-CM

## 2018-10-03 DIAGNOSIS — N179 Acute kidney failure, unspecified: ICD-10-CM

## 2018-10-03 DIAGNOSIS — E440 Moderate protein-calorie malnutrition: ICD-10-CM

## 2018-10-03 DIAGNOSIS — E8770 Fluid overload, unspecified: ICD-10-CM

## 2018-10-03 DIAGNOSIS — R188 Other ascites: ICD-10-CM

## 2018-10-03 DIAGNOSIS — N390 Urinary tract infection, site not specified: ICD-10-CM

## 2018-10-03 DIAGNOSIS — E46 Unspecified protein-calorie malnutrition: ICD-10-CM

## 2018-10-03 LAB — MAGNESIUM: Lab: 2 mg/dL — ABNORMAL LOW (ref >60)

## 2018-10-03 LAB — PHOSPHORUS: Lab: 3.5 mg/dL — ABNORMAL LOW (ref >60)

## 2018-10-03 LAB — BASIC METABOLIC PANEL
Lab: 136 MMOL/L — ABNORMAL LOW (ref 137–147)
Lab: 3.3 MMOL/L — ABNORMAL LOW (ref 3.5–5.1)

## 2018-10-03 LAB — CULTURE-WOUND/TISSUE/FLUID(AEROBIC ONLY)W/SENSITIVITY

## 2018-10-03 LAB — POC GLUCOSE
Lab: 161 mg/dL — ABNORMAL HIGH (ref 70–100)
Lab: 169 mg/dL — ABNORMAL HIGH (ref 70–100)
Lab: 141 mg/dL — ABNORMAL HIGH (ref 70–100)
Lab: 127 mg/dL — ABNORMAL HIGH (ref 70–100)

## 2018-10-03 MED ORDER — AMOXICILLIN 500 MG PO CAP
1000 mg | Freq: Three times a day (TID) | ORAL | 0 refills | Status: CP
Start: 2018-10-03 — End: ?
  Administered 2018-10-03 – 2018-10-10 (×20): 1000 mg via ORAL

## 2018-10-03 MED ORDER — ACETAMINOPHEN 325 MG PO TAB
650 mg | ORAL | 0 refills | Status: DC | PRN
Start: 2018-10-03 — End: 2018-10-14
  Administered 2018-10-08 – 2018-10-11 (×3): 650 mg via ORAL

## 2018-10-03 MED ORDER — SENNOSIDES 8.6 MG PO TAB
2 | Freq: Every evening | ORAL | 0 refills | Status: DC
Start: 2018-10-03 — End: 2018-10-03

## 2018-10-03 MED ORDER — METOPROLOL SUCCINATE 50 MG PO TB24
50 mg | Freq: Every evening | ORAL | 0 refills | Status: DC
Start: 2018-10-03 — End: 2018-10-14
  Administered 2018-10-04 – 2018-10-14 (×5): 50 mg via ORAL

## 2018-10-03 MED ORDER — AMOXICILLIN 500 MG PO CAP
1000 mg | Freq: Three times a day (TID) | ORAL | 0 refills | Status: DC
Start: 2018-10-03 — End: 2018-10-03

## 2018-10-03 MED ORDER — FUROSEMIDE 40 MG PO TAB
80 mg | Freq: Every day | ORAL | 0 refills | Status: DC
Start: 2018-10-03 — End: 2018-10-07
  Administered 2018-10-04 – 2018-10-07 (×4): 80 mg via ORAL

## 2018-10-03 MED ORDER — FLUCONAZOLE 200 MG PO TAB
200 mg | ORAL_TABLET | Freq: Every day | ORAL | 0 refills | Status: SS
Start: 2018-10-03 — End: 2018-10-14

## 2018-10-03 MED ORDER — ALUMINUM-MAGNESIUM HYDROXIDE 200-200 MG/5 ML PO SUSP
30 mL | ORAL | 0 refills | Status: DC | PRN
Start: 2018-10-03 — End: 2018-10-14

## 2018-10-03 MED ORDER — FLUCONAZOLE 200 MG PO TAB
200 mg | Freq: Every day | ORAL | 0 refills | Status: CP
Start: 2018-10-03 — End: ?
  Administered 2018-10-04 – 2018-10-13 (×10): 200 mg via ORAL

## 2018-10-03 MED ORDER — MIDODRINE 5 MG PO TAB
5 mg | ORAL_TABLET | ORAL | 11 refills | Status: AC
Start: 2018-10-03 — End: ?

## 2018-10-03 MED ORDER — PANTOPRAZOLE 40 MG PO TBEC
40 mg | Freq: Two times a day (BID) | ORAL | 0 refills | Status: DC
Start: 2018-10-03 — End: 2018-10-14
  Administered 2018-10-04 – 2018-10-14 (×22): 40 mg via ORAL

## 2018-10-03 MED ORDER — ENOXAPARIN 40 MG/0.4 ML SC SYRG
40 mg | Freq: Every day | SUBCUTANEOUS | 0 refills | Status: DC
Start: 2018-10-03 — End: 2018-10-14
  Administered 2018-10-04 – 2018-10-14 (×10): 40 mg via SUBCUTANEOUS

## 2018-10-03 MED ORDER — AMOXICILLIN 500 MG PO CAP
1000 mg | ORAL_CAPSULE | Freq: Three times a day (TID) | ORAL | 0 refills | 7.00000 days | Status: AC
Start: 2018-10-03 — End: 2018-10-14

## 2018-10-03 MED ORDER — SODIUM CHLORIDE 0.9 % FLUSH
5-10 mL | Freq: Three times a day (TID) | 0 refills | Status: DC
Start: 2018-10-03 — End: 2018-10-03

## 2018-10-03 MED ORDER — MAGNESIUM HYDROXIDE 2,400 MG/10 ML PO SUSP
10 mL | ORAL | 0 refills | Status: DC | PRN
Start: 2018-10-03 — End: 2018-10-14

## 2018-10-03 MED ORDER — MEGESTROL 400 MG/10 ML (40 MG/ML) PO SUSP
800 mg | Freq: Every day | ORAL | 11 refills | 42.00000 days | Status: AC
Start: 2018-10-03 — End: 2018-10-14

## 2018-10-03 MED ORDER — ONDANSETRON HCL 4 MG PO TAB
4 mg | ORAL | 0 refills | Status: DC | PRN
Start: 2018-10-03 — End: 2018-10-14
  Administered 2018-10-05 – 2018-10-13 (×2): 4 mg via ORAL

## 2018-10-03 MED ORDER — FUROSEMIDE 80 MG PO TAB
80 mg | ORAL_TABLET | Freq: Every day | ORAL | 5 refills | Status: SS
Start: 2018-10-03 — End: 2018-11-04

## 2018-10-03 MED ORDER — MIRTAZAPINE 7.5 MG PO TAB
7.5 mg | ORAL_TABLET | Freq: Every evening | ORAL | 2 refills | Status: AC
Start: 2018-10-03 — End: ?

## 2018-10-03 MED ORDER — ERGOCALCIFEROL (VITAMIN D2) 50,000 UNIT PO CAP
50000 [IU] | ORAL | 0 refills | Status: DC
Start: 2018-10-03 — End: 2018-10-14
  Administered 2018-10-10: 15:00:00 50000 [IU] via ORAL

## 2018-10-03 MED ORDER — LOPERAMIDE 2 MG PO CAP
2 mg | Freq: Three times a day (TID) | ORAL | 0 refills | Status: DC | PRN
Start: 2018-10-03 — End: 2018-10-14
  Administered 2018-10-05 – 2018-10-09 (×3): 2 mg via ORAL

## 2018-10-03 MED ORDER — DOCUSATE SODIUM 100 MG PO CAP
100 mg | Freq: Two times a day (BID) | ORAL | 0 refills | Status: DC
Start: 2018-10-03 — End: 2018-10-03

## 2018-10-03 MED ORDER — ADULT PN CYCLIC-ION BASED
INTRAVENOUS | 0 refills | Status: DC
Start: 2018-10-03 — End: 2018-10-03

## 2018-10-03 MED ORDER — FUROSEMIDE 20 MG PO TAB
80 mg | Freq: Every day | ORAL | 0 refills | Status: DC
Start: 2018-10-03 — End: 2018-10-03
  Administered 2018-10-03: 13:00:00 80 mg via ORAL

## 2018-10-03 MED ORDER — MEGESTROL 400 MG/10 ML (40 MG/ML) PO SUSP
800 mg | Freq: Every day | ORAL | 0 refills | Status: DC
Start: 2018-10-03 — End: 2018-10-07
  Administered 2018-10-04 – 2018-10-07 (×4): 800 mg via ORAL

## 2018-10-03 MED ORDER — SODIUM CHLORIDE 0.9 % FLUSH
20 mL | Freq: Three times a day (TID) | 0 refills | Status: DC
Start: 2018-10-03 — End: 2018-10-14

## 2018-10-03 MED ORDER — ADULT PN CYCLIC-ION BASED
INTRAVENOUS | 0 refills | Status: CP
Start: 2018-10-03 — End: ?
  Administered 2018-10-04 – 2018-10-06 (×3): 1320.000 mL via INTRAVENOUS

## 2018-10-03 MED ORDER — MIDODRINE 5 MG PO TAB
5 mg | ORAL | 0 refills | Status: DC
Start: 2018-10-03 — End: 2018-10-14
  Administered 2018-10-03 – 2018-10-14 (×33): 5 mg via ORAL

## 2018-10-03 MED ORDER — MIRTAZAPINE 15 MG PO TAB
7.5 mg | Freq: Every evening | ORAL | 0 refills | Status: DC
Start: 2018-10-03 — End: 2018-10-14
  Administered 2018-10-04 – 2018-10-14 (×11): 7.5 mg via ORAL

## 2018-10-03 MED ORDER — ERGOCALCIFEROL (VITAMIN D2) 50,000 UNIT PO CAP
1 | ORAL_CAPSULE | ORAL | 0 refills | 56.00000 days | Status: AC
Start: 2018-10-03 — End: ?

## 2018-10-04 LAB — POC GLUCOSE
Lab: 168 mg/dL — ABNORMAL HIGH (ref 70–100)
Lab: 166 mg/dL — ABNORMAL HIGH (ref 70–100)
Lab: 105 mg/dL — ABNORMAL HIGH (ref 70–100)
Lab: 139 mg/dL — ABNORMAL HIGH (ref 70–100)

## 2018-10-04 LAB — CBC CELLULAR THERAPEUTICS: Lab: 10 10*3/uL — ABNORMAL HIGH (ref 4.5–11.0)

## 2018-10-04 LAB — BASIC METABOLIC PANEL CELLULAR THERAPEUTICS: Lab: 142 MMOL/L — ABNORMAL LOW (ref 137–147)

## 2018-10-04 LAB — TRIGLYCERIDE: Lab: 133 mg/dL — ABNORMAL LOW (ref <150)

## 2018-10-05 LAB — POC GLUCOSE
Lab: 156 mg/dL — ABNORMAL HIGH (ref 70–100)
Lab: 144 mg/dL — ABNORMAL HIGH (ref 70–100)
Lab: 142 mg/dL — ABNORMAL HIGH (ref >60)
Lab: 107 mg/dL — ABNORMAL HIGH (ref 70–100)
Lab: 105 mg/dL — ABNORMAL HIGH (ref 70–100)

## 2018-10-05 LAB — CBC CELLULAR THERAPEUTICS: Lab: 13 K/UL — ABNORMAL HIGH (ref >60)

## 2018-10-05 LAB — BASIC METABOLIC PANEL CELLULAR THERAPEUTICS: Lab: 143 MMOL/L — ABNORMAL LOW (ref 137–147)

## 2018-10-06 LAB — POC GLUCOSE
Lab: 134 mg/dL — ABNORMAL HIGH (ref >60)
Lab: 137 mg/dL — ABNORMAL HIGH (ref 70–100)
Lab: 117 mg/dL — ABNORMAL HIGH (ref 70–100)
Lab: 109 mg/dL — ABNORMAL HIGH (ref 70–100)

## 2018-10-06 LAB — TRIGLYCERIDE: Lab: 135 mg/dL — ABNORMAL LOW (ref <150)

## 2018-10-06 LAB — CBC CELLULAR THERAPEUTICS: Lab: 12 K/UL — ABNORMAL HIGH (ref >60)

## 2018-10-06 LAB — BASIC METABOLIC PANEL CELLULAR THERAPEUTICS: Lab: 145 MMOL/L — ABNORMAL LOW (ref >60)

## 2018-10-06 MED ORDER — ADULT PN CYCLIC-ION BASED
INTRAVENOUS | 0 refills | Status: CP
Start: 2018-10-06 — End: ?
  Administered 2018-10-07: 02:00:00 1320.000 mL via INTRAVENOUS

## 2018-10-07 ENCOUNTER — Encounter: Admit: 2018-10-07 | Discharge: 2018-10-07 | Payer: MEDICARE

## 2018-10-07 LAB — POC GLUCOSE
Lab: 127 mg/dL — ABNORMAL HIGH (ref 70–100)
Lab: 132 mg/dL — ABNORMAL HIGH (ref 70–100)
Lab: 98 mg/dL (ref 70–100)

## 2018-10-07 LAB — CBC CELLULAR THERAPEUTICS: Lab: 16 K/UL — ABNORMAL HIGH (ref >60)

## 2018-10-07 LAB — BASIC METABOLIC PANEL CELLULAR THERAPEUTICS: Lab: 138 MMOL/L — ABNORMAL LOW (ref >60)

## 2018-10-07 LAB — PHOSPHORUS: Lab: 3.1 mg/dL — ABNORMAL LOW (ref 2.0–4.5)

## 2018-10-07 LAB — MAGNESIUM: Lab: 2.2 mg/dL — ABNORMAL HIGH (ref 1.6–2.6)

## 2018-10-07 MED ORDER — MEGESTROL 400 MG/10 ML (40 MG/ML) PO SUSP
800 mg | Freq: Every day | ORAL | 0 refills | Status: DC
Start: 2018-10-07 — End: 2018-10-08
  Administered 2018-10-08: 13:00:00 800 mg via ORAL

## 2018-10-07 MED ORDER — MEGESTROL 400 MG/10 ML (10 ML) PO SUSP
800 mg | Freq: Every day | ORAL | 0 refills | Status: DC
Start: 2018-10-07 — End: 2018-10-07

## 2018-10-07 MED ORDER — NYSTATIN 100,000 UNIT/ML PO SUSP
500000 [IU] | Freq: Four times a day (QID) | ORAL | 0 refills | Status: DC
Start: 2018-10-07 — End: 2018-10-08
  Administered 2018-10-07 – 2018-10-08 (×3): 500000 [IU] via ORAL

## 2018-10-07 MED ORDER — NYSTATIN 100,000 UNIT/ML PO SUSP
500000 [IU] | Freq: Four times a day (QID) | ORAL | 0 refills | Status: DC
Start: 2018-10-07 — End: 2018-10-07
  Administered 2018-10-07: 19:00:00 500000 [IU] via ORAL

## 2018-10-07 MED ORDER — FUROSEMIDE 40 MG PO TAB
40 mg | Freq: Every day | ORAL | 0 refills | Status: DC
Start: 2018-10-07 — End: 2018-10-14
  Administered 2018-10-08 – 2018-10-14 (×7): 40 mg via ORAL

## 2018-10-07 MED ORDER — MEGESTROL 400 MG/10 ML (40 MG/ML) PO SUSP
200 mg | Freq: Every day | ORAL | 0 refills | Status: DC
Start: 2018-10-07 — End: 2018-10-07

## 2018-10-07 MED ORDER — ADULT PN CYCLIC-ION BASED
INTRAVENOUS | 0 refills | Status: CP
Start: 2018-10-07 — End: ?
  Administered 2018-10-08: 02:00:00 1320.000 mL via INTRAVENOUS

## 2018-10-08 LAB — POC GLUCOSE
Lab: 129 mg/dL — ABNORMAL HIGH (ref 70–100)
Lab: 107 mg/dL — ABNORMAL HIGH (ref 70–100)

## 2018-10-08 LAB — BASIC METABOLIC PANEL CELLULAR THERAPEUTICS
Lab: 138 MMOL/L — ABNORMAL LOW (ref 137–147)
Lab: 4 MMOL/L — ABNORMAL LOW (ref >60)

## 2018-10-08 LAB — CBC CELLULAR THERAPEUTICS: Lab: 9.8 K/UL — ABNORMAL HIGH (ref 4.5–11.0)

## 2018-10-08 MED ORDER — DRONABINOL 2.5 MG PO CAP
2.5 mg | Freq: Every day | ORAL | 0 refills | Status: DC
Start: 2018-10-08 — End: 2018-10-14
  Administered 2018-10-09 – 2018-10-13 (×5): 2.5 mg via ORAL

## 2018-10-08 MED ORDER — ADULT PN CYCLIC-ION BASED
INTRAVENOUS | 0 refills | Status: CP
Start: 2018-10-08 — End: ?
  Administered 2018-10-09 – 2018-10-10 (×2): 1320.000 mL via INTRAVENOUS

## 2018-10-08 MED ORDER — NYSTATIN 100,000 UNIT/ML PO SUSP
500000 [IU] | Freq: Four times a day (QID) | ORAL | 0 refills | Status: CP
Start: 2018-10-08 — End: ?
  Administered 2018-10-08 – 2018-10-13 (×20): 500000 [IU] via ORAL

## 2018-10-08 MED ORDER — DRONABINOL 2.5 MG PO CAP
2.5 mg | Freq: Two times a day (BID) | ORAL | 0 refills | Status: DC
Start: 2018-10-08 — End: 2018-10-08
  Administered 2018-10-08: 17:00:00 2.5 mg via ORAL

## 2018-10-09 LAB — POC GLUCOSE
Lab: 126 mg/dL — ABNORMAL HIGH (ref 70–100)
Lab: 135 mg/dL — ABNORMAL HIGH (ref 70–100)
Lab: 155 mg/dL — ABNORMAL HIGH (ref 70–100)

## 2018-10-09 LAB — TRIGLYCERIDE: Lab: 175 mg/dL — ABNORMAL HIGH (ref <150)

## 2018-10-10 LAB — POC GLUCOSE
Lab: 104 mg/dL — ABNORMAL HIGH (ref 70–100)
Lab: 111 mg/dL — ABNORMAL HIGH (ref 70–100)
Lab: 130 mg/dL — ABNORMAL HIGH (ref 70–100)
Lab: 138 mg/dL — ABNORMAL HIGH (ref 70–100)
Lab: 94 mg/dL (ref 70–100)

## 2018-10-10 LAB — CBC CELLULAR THERAPEUTICS: Lab: 18 K/UL — ABNORMAL HIGH (ref 4.5–11.0)

## 2018-10-10 LAB — URINALYSIS MICROSCOPIC REFLEX TO CULTURE

## 2018-10-10 LAB — URINALYSIS DIPSTICK REFLEX TO CULTURE
Lab: NEGATIVE
Lab: NEGATIVE

## 2018-10-10 LAB — BASIC METABOLIC PANEL CELLULAR THERAPEUTICS: Lab: 135 MMOL/L — ABNORMAL LOW (ref 137–147)

## 2018-10-10 LAB — MAGNESIUM: Lab: 2.3 mg/dL (ref 1.6–2.6)

## 2018-10-10 LAB — TRIGLYCERIDE: Lab: 199 mg/dL — ABNORMAL HIGH (ref <150)

## 2018-10-10 LAB — PHOSPHORUS: Lab: 3.6 mg/dL — ABNORMAL LOW (ref 2.0–4.5)

## 2018-10-10 MED ORDER — ADULT PN CYCLIC-ION BASED
INTRAVENOUS | 0 refills | Status: CP
Start: 2018-10-10 — End: ?
  Administered 2018-10-11 – 2018-10-13 (×3): 1320.000 mL via INTRAVENOUS

## 2018-10-10 MED ORDER — LEVOFLOXACIN 750 MG PO TAB
750 mg | ORAL | 0 refills | Status: DC
Start: 2018-10-10 — End: 2018-10-14
  Administered 2018-10-11 – 2018-10-14 (×4): 750 mg via ORAL

## 2018-10-11 LAB — POC GLUCOSE
Lab: 107 mg/dL — ABNORMAL HIGH (ref 70–100)
Lab: 128 mg/dL — ABNORMAL HIGH (ref 70–100)
Lab: 151 mg/dL — ABNORMAL HIGH (ref 70–100)
Lab: 167 mg/dL — ABNORMAL HIGH (ref 70–100)

## 2018-10-11 LAB — CBC AND DIFF
Lab: 7.8 g/dL — ABNORMAL LOW (ref 12.0–15.0)
Lab: 9.8 K/UL (ref 4.5–11.0)

## 2018-10-11 LAB — CULTURE-URINE W/SENSITIVITY

## 2018-10-12 ENCOUNTER — Inpatient Hospital Stay: Admit: 2018-10-12 | Discharge: 2018-10-12 | Payer: MEDICARE

## 2018-10-12 DIAGNOSIS — R54 Age-related physical debility: Principal | ICD-10-CM

## 2018-10-12 LAB — CBC AND DIFF
Lab: 0 % (ref 0–2)
Lab: 0 % (ref 0–5)
Lab: 0 10*3/uL (ref 0–0.20)
Lab: 0 10*3/uL (ref 0–0.45)
Lab: 0.5 10*3/uL (ref 0–0.80)
Lab: 102 FL — ABNORMAL HIGH (ref 80–100)
Lab: 2.1 10*3/uL (ref 1.8–7.0)
Lab: 2.4 M/UL — ABNORMAL LOW (ref 4.0–5.0)
Lab: 21 % — ABNORMAL HIGH (ref 11–15)
Lab: 22 % — ABNORMAL LOW (ref 41–77)
Lab: 24 % — ABNORMAL LOW (ref 36–45)
Lab: 33 g/dL (ref 32.0–36.0)
Lab: 331 10*3/uL (ref 150–400)
Lab: 34 pg — ABNORMAL HIGH (ref 26–34)
Lab: 5 % (ref 4–12)
Lab: 6.8 10*3/uL — ABNORMAL HIGH (ref 1.0–4.8)
Lab: 73 % — ABNORMAL HIGH (ref 24–44)
Lab: 8.3 g/dL — ABNORMAL LOW (ref 12.0–15.0)
Lab: 9.1 FL (ref 7–11)
Lab: 9.5 K/UL (ref 4.5–11.0)

## 2018-10-12 LAB — POC GLUCOSE
Lab: 144 mg/dL — ABNORMAL HIGH (ref 70–100)
Lab: 135 mg/dL — ABNORMAL HIGH (ref 70–100)
Lab: 119 mg/dL — ABNORMAL HIGH (ref 70–100)
Lab: 132 mg/dL — ABNORMAL HIGH (ref >60)

## 2018-10-12 LAB — BASIC METABOLIC PANEL CELLULAR THERAPEUTICS
Lab: 102 MMOL/L — ABNORMAL HIGH (ref >60)
Lab: 132 MMOL/L — ABNORMAL LOW (ref >60)
Lab: 3.9 MMOL/L — ABNORMAL HIGH (ref 3.5–5.1)

## 2018-10-12 MED ORDER — ACETAMINOPHEN 325 MG PO TAB
650 mg | Freq: Once | ORAL | 0 refills | Status: CP
Start: 2018-10-12 — End: ?
  Administered 2018-10-12: 10:00:00 650 mg via ORAL

## 2018-10-12 MED ORDER — SODIUM CHLORIDE 0.9 % IV SOLP
1000 mL | Freq: Once | INTRAVENOUS | 0 refills | Status: CP
Start: 2018-10-12 — End: ?
  Administered 2018-10-12: 16:00:00 1000 mL via INTRAVENOUS

## 2018-10-13 ENCOUNTER — Encounter: Admit: 2018-10-13 | Discharge: 2018-10-13 | Payer: MEDICARE

## 2018-10-13 LAB — PHOSPHORUS: Lab: 3 mg/dL (ref 2.0–4.5)

## 2018-10-13 LAB — POC GLUCOSE
Lab: 130 mg/dL — ABNORMAL HIGH (ref 70–100)
Lab: 162 mg/dL — ABNORMAL HIGH (ref 70–100)
Lab: 106 mg/dL — ABNORMAL HIGH (ref 70–100)
Lab: 101 mg/dL — ABNORMAL HIGH (ref 70–100)

## 2018-10-13 LAB — BASIC METABOLIC PANEL CELLULAR THERAPEUTICS: Lab: 136 MMOL/L — ABNORMAL LOW (ref >60)

## 2018-10-13 LAB — MAGNESIUM: Lab: 2.3 mg/dL (ref 1.6–2.6)

## 2018-10-13 LAB — TRIGLYCERIDE: Lab: 218 mg/dL — ABNORMAL HIGH (ref <150)

## 2018-10-13 LAB — CBC CELLULAR THERAPEUTICS: Lab: 10 K/UL — ABNORMAL LOW (ref 4.5–11.0)

## 2018-10-13 MED ORDER — ADULT PN CYCLIC-ION BASED
INTRAVENOUS | 0 refills | Status: DC
Start: 2018-10-13 — End: 2018-10-14
  Administered 2018-10-14: 02:00:00 1320.000 mL via INTRAVENOUS

## 2018-10-14 ENCOUNTER — Inpatient Hospital Stay: Admit: 2018-10-03 | Discharge: 2018-10-14 | Payer: MEDICARE

## 2018-10-14 DIAGNOSIS — Z955 Presence of coronary angioplasty implant and graft: ICD-10-CM

## 2018-10-14 DIAGNOSIS — I10 Essential (primary) hypertension: ICD-10-CM

## 2018-10-14 DIAGNOSIS — E440 Moderate protein-calorie malnutrition: ICD-10-CM

## 2018-10-14 DIAGNOSIS — J90 Pleural effusion, not elsewhere classified: ICD-10-CM

## 2018-10-14 DIAGNOSIS — K219 Gastro-esophageal reflux disease without esophagitis: ICD-10-CM

## 2018-10-14 DIAGNOSIS — N179 Acute kidney failure, unspecified: ICD-10-CM

## 2018-10-14 DIAGNOSIS — F4321 Adjustment disorder with depressed mood: ICD-10-CM

## 2018-10-14 DIAGNOSIS — Z9049 Acquired absence of other specified parts of digestive tract: ICD-10-CM

## 2018-10-14 DIAGNOSIS — I2510 Atherosclerotic heart disease of native coronary artery without angina pectoris: ICD-10-CM

## 2018-10-14 DIAGNOSIS — Z87440 Personal history of urinary (tract) infections: ICD-10-CM

## 2018-10-14 DIAGNOSIS — D509 Iron deficiency anemia, unspecified: ICD-10-CM

## 2018-10-14 DIAGNOSIS — I959 Hypotension, unspecified: ICD-10-CM

## 2018-10-14 DIAGNOSIS — Z90710 Acquired absence of both cervix and uterus: ICD-10-CM

## 2018-10-14 DIAGNOSIS — E876 Hypokalemia: ICD-10-CM

## 2018-10-14 DIAGNOSIS — R54 Age-related physical debility: Principal | ICD-10-CM

## 2018-10-14 DIAGNOSIS — Z8541 Personal history of malignant neoplasm of cervix uteri: ICD-10-CM

## 2018-10-14 DIAGNOSIS — E871 Hypo-osmolality and hyponatremia: ICD-10-CM

## 2018-10-14 DIAGNOSIS — B3749 Other urogenital candidiasis: ICD-10-CM

## 2018-10-14 DIAGNOSIS — H9190 Unspecified hearing loss, unspecified ear: ICD-10-CM

## 2018-10-14 DIAGNOSIS — E785 Hyperlipidemia, unspecified: ICD-10-CM

## 2018-10-14 DIAGNOSIS — R188 Other ascites: ICD-10-CM

## 2018-10-14 DIAGNOSIS — F411 Generalized anxiety disorder: ICD-10-CM

## 2018-10-14 DIAGNOSIS — M549 Dorsalgia, unspecified: ICD-10-CM

## 2018-10-14 DIAGNOSIS — G8918 Other acute postprocedural pain: ICD-10-CM

## 2018-10-14 DIAGNOSIS — H547 Unspecified visual loss: ICD-10-CM

## 2018-10-14 LAB — PHOSPHORUS: Lab: 3.3 mg/dL — CL (ref 2.0–4.5)

## 2018-10-14 LAB — POC GLUCOSE
Lab: 146 mg/dL — ABNORMAL HIGH (ref 70–100)
Lab: 157 mg/dL — ABNORMAL HIGH (ref 70–100)

## 2018-10-14 LAB — BASIC METABOLIC PANEL: Lab: 137 MMOL/L (ref 137–147)

## 2018-10-14 MED ORDER — DRONABINOL 2.5 MG PO CAP
2.5 mg | ORAL_CAPSULE | Freq: Every day | ORAL | 0 refills | Status: SS
Start: 2018-10-14 — End: 2018-11-01

## 2018-10-14 MED ORDER — ONDANSETRON HCL 4 MG PO TAB
4 mg | ORAL_TABLET | ORAL | 0 refills | 8.00000 days | Status: AC | PRN
Start: 2018-10-14 — End: ?

## 2018-10-14 MED ORDER — METOPROLOL SUCCINATE 50 MG PO TB24
50 mg | ORAL_TABLET | Freq: Every evening | ORAL | 1 refills | 90.00000 days | Status: AC
Start: 2018-10-14 — End: ?

## 2018-10-14 MED ORDER — FLUCONAZOLE 200 MG PO TAB
200 mg | ORAL_TABLET | Freq: Every day | ORAL | 0 refills | 3.00000 days | Status: AC
Start: 2018-10-14 — End: ?

## 2018-10-16 ENCOUNTER — Encounter: Admit: 2018-10-16 | Discharge: 2018-10-16 | Payer: MEDICARE

## 2018-10-18 LAB — CULTURE-BLOOD W/SENSITIVITY

## 2018-10-20 ENCOUNTER — Encounter: Admit: 2018-10-20 | Discharge: 2018-10-20 | Payer: MEDICARE

## 2018-10-27 LAB — CULTURE-FUNGAL,OTHER

## 2018-10-31 ENCOUNTER — Encounter: Admit: 2018-10-31 | Discharge: 2018-10-31 | Payer: MEDICARE

## 2018-11-01 ENCOUNTER — Encounter: Admit: 2018-11-01 | Discharge: 2018-11-01 | Payer: MEDICARE

## 2018-11-01 LAB — URINALYSIS DIPSTICK
Lab: NEGATIVE
Lab: NEGATIVE
Lab: NEGATIVE
Lab: NEGATIVE

## 2018-11-01 LAB — CBC: Lab: 12 10*3/uL — ABNORMAL HIGH (ref 4.5–11.0)

## 2018-11-01 LAB — URINALYSIS, MICROSCOPIC

## 2018-11-01 LAB — MAGNESIUM: Lab: 1.3 mg/dL — ABNORMAL LOW (ref 1.6–2.6)

## 2018-11-01 LAB — LACTIC ACID(LACTATE): Lab: 1.2 MMOL/L (ref 0.5–2.0)

## 2018-11-01 LAB — PHOSPHORUS: Lab: 3.1 mg/dL — ABNORMAL LOW (ref 2.0–4.5)

## 2018-11-01 LAB — COMPREHENSIVE METABOLIC PANEL: Lab: 137 MMOL/L — ABNORMAL LOW (ref 137–147)

## 2018-11-01 MED ORDER — ACETAMINOPHEN 325 MG PO TAB
650 mg | ORAL | 0 refills | Status: DC | PRN
Start: 2018-11-01 — End: 2018-11-08
  Administered 2018-11-03 – 2018-11-07 (×3): 650 mg via ORAL

## 2018-11-01 MED ORDER — VANCOMYCIN 1G/250ML D5W IVPB (VIAL2BAG)
15 mg/kg | INTRAVENOUS | 0 refills | Status: DC
Start: 2018-11-01 — End: 2018-11-02
  Administered 2018-11-01 – 2018-11-02 (×4): 1000 mg via INTRAVENOUS

## 2018-11-01 MED ORDER — CETIRIZINE 10 MG PO TAB
10 mg | Freq: Every evening | ORAL | 0 refills | Status: DC
Start: 2018-11-01 — End: 2018-11-08
  Administered 2018-11-02 – 2018-11-07 (×6): 10 mg via ORAL

## 2018-11-01 MED ORDER — MIRTAZAPINE 7.5 MG PO TAB
7.5 mg | Freq: Every evening | ORAL | 0 refills | Status: DC
Start: 2018-11-01 — End: 2018-11-08
  Administered 2018-11-02 – 2018-11-07 (×6): 7.5 mg via ORAL

## 2018-11-01 MED ORDER — FUROSEMIDE 20 MG PO TAB
40 mg | Freq: Every day | ORAL | 0 refills | Status: DC
Start: 2018-11-01 — End: 2018-11-08
  Administered 2018-11-02 – 2018-11-07 (×6): 40 mg via ORAL

## 2018-11-01 MED ORDER — PIPERACILLIN/TAZOBACTAM 3.375 G/NS IVPB (MB+)
3.375 g | INTRAVENOUS | 0 refills | Status: DC
Start: 2018-11-01 — End: 2018-11-06
  Administered 2018-11-01 – 2018-11-06 (×44): 3.375 g via INTRAVENOUS

## 2018-11-01 MED ORDER — VANCOMYCIN PHARMACY TO MANAGE
1 | 0 refills | Status: DC
Start: 2018-11-01 — End: 2018-11-02

## 2018-11-01 MED ORDER — SODIUM CHLORIDE 0.9 % IV SOLP
INTRAVENOUS | 0 refills | Status: DC
Start: 2018-11-01 — End: 2018-11-02
  Administered 2018-11-01: 22:00:00 1000.000 mL via INTRAVENOUS

## 2018-11-01 MED ORDER — PANTOPRAZOLE 40 MG PO TBEC
40 mg | Freq: Every day | ORAL | 0 refills | Status: DC
Start: 2018-11-01 — End: 2018-11-08
  Administered 2018-11-02 – 2018-11-07 (×6): 40 mg via ORAL

## 2018-11-01 MED ORDER — ROSUVASTATIN 10 MG PO TAB
20 mg | Freq: Every evening | ORAL | 0 refills | Status: DC
Start: 2018-11-01 — End: 2018-11-08
  Administered 2018-11-02 – 2018-11-07 (×6): 20 mg via ORAL

## 2018-11-01 MED ORDER — MIDODRINE 5 MG PO TAB
5 mg | ORAL | 0 refills | Status: DC
Start: 2018-11-01 — End: 2018-11-08
  Administered 2018-11-01 – 2018-11-07 (×19): 5 mg via ORAL

## 2018-11-01 MED ORDER — DRONABINOL 2.5 MG PO CAP
2.5 mg | Freq: Every day | ORAL | 0 refills | Status: DC
Start: 2018-11-01 — End: 2018-11-01

## 2018-11-01 MED ORDER — MEGESTROL 40 MG PO TAB
120 mg | Freq: Three times a day (TID) | ORAL | 0 refills | Status: DC
Start: 2018-11-01 — End: 2018-11-08
  Administered 2018-11-01 – 2018-11-07 (×14): 120 mg via ORAL

## 2018-11-01 MED ORDER — FUROSEMIDE 20 MG PO TAB
80 mg | Freq: Every day | ORAL | 0 refills | Status: DC
Start: 2018-11-01 — End: 2018-11-02
  Administered 2018-11-01: 14:00:00 80 mg via ORAL

## 2018-11-01 MED ORDER — METOPROLOL SUCCINATE 50 MG PO TB24
50 mg | Freq: Every evening | ORAL | 0 refills | Status: DC
Start: 2018-11-01 — End: 2018-11-08
  Administered 2018-11-02 – 2018-11-07 (×6): 50 mg via ORAL

## 2018-11-01 MED ORDER — ONDANSETRON HCL 8 MG PO TAB
4 mg | ORAL | 0 refills | Status: DC | PRN
Start: 2018-11-01 — End: 2018-11-08

## 2018-11-01 MED ORDER — ONDANSETRON HCL (PF) 4 MG/2 ML IJ SOLN
4 mg | INTRAVENOUS | 0 refills | Status: DC | PRN
Start: 2018-11-01 — End: 2018-11-08
  Administered 2018-11-05: 04:00:00 4 mg via INTRAVENOUS

## 2018-11-02 LAB — PHOSPHORUS: Lab: 2.8 mg/dL — ABNORMAL HIGH (ref 2.0–4.5)

## 2018-11-02 LAB — COMPREHENSIVE METABOLIC PANEL: Lab: 137 MMOL/L — ABNORMAL LOW (ref >60)

## 2018-11-02 LAB — MAGNESIUM: Lab: 1.3 mg/dL — ABNORMAL LOW (ref >60)

## 2018-11-02 LAB — HEMOGLOBIN & HEMATOCRIT: Lab: 10 g/dL — ABNORMAL LOW (ref >60)

## 2018-11-02 LAB — CBC: Lab: 11 K/UL — ABNORMAL HIGH (ref >60)

## 2018-11-02 LAB — PREALBUMIN: Lab: 5 mg/dL — ABNORMAL LOW (ref >60)

## 2018-11-02 LAB — CULTURE-URINE W/SENSITIVITY

## 2018-11-02 MED ORDER — MAGNESIUM OXIDE 400 MG (241.3 MG MAGNESIUM) PO TAB
400 mg | Freq: Two times a day (BID) | ORAL | 0 refills | Status: CP
Start: 2018-11-02 — End: ?
  Administered 2018-11-02 – 2018-11-03 (×2): 400 mg via ORAL

## 2018-11-02 MED ORDER — MELATONIN 1 MG PO TAB
1 mg | Freq: Every evening | ORAL | 0 refills | Status: DC
Start: 2018-11-02 — End: 2018-11-04
  Administered 2018-11-03 – 2018-11-04 (×2): 1 mg via ORAL

## 2018-11-02 MED ORDER — POTASSIUM CHLORIDE 20 MEQ PO TBTQ
40 meq | ORAL | 0 refills | Status: CP
Start: 2018-11-02 — End: ?
  Administered 2018-11-02: 14:00:00 40 meq via ORAL

## 2018-11-02 MED ORDER — POTASSIUM, SODIUM PHOSPHATES 280-160-250 MG PO PWPK
2 | Freq: Once | ORAL | 0 refills | Status: CP
Start: 2018-11-02 — End: ?
  Administered 2018-11-02: 14:00:00 500 mg via ORAL

## 2018-11-03 ENCOUNTER — Inpatient Hospital Stay: Admit: 2018-11-07 | Discharge: 2018-11-07 | Payer: MEDICARE

## 2018-11-03 ENCOUNTER — Encounter: Admit: 2018-11-03 | Discharge: 2018-11-03 | Payer: MEDICARE

## 2018-11-03 DIAGNOSIS — K921 Melena: Principal | ICD-10-CM

## 2018-11-03 LAB — CULTURE-FUNGAL,OTHER

## 2018-11-03 LAB — PHOSPHORUS: Lab: 2.5 mg/dL — ABNORMAL HIGH (ref 2.0–4.5)

## 2018-11-03 LAB — DRAIN FLUID CREATININE: Lab: 0.8 mg/dL

## 2018-11-03 LAB — COMPREHENSIVE METABOLIC PANEL: Lab: 139 MMOL/L — ABNORMAL LOW (ref 137–147)

## 2018-11-03 LAB — MAGNESIUM: Lab: 1.3 mg/dL — ABNORMAL LOW (ref >60)

## 2018-11-03 LAB — CBC: Lab: 10 K/UL — ABNORMAL LOW (ref 4.5–11.0)

## 2018-11-03 MED ORDER — MULTIVIT-IRON-FA-CALCIUM-MINS 9 MG IRON-400 MCG PO TAB
1 | Freq: Every day | ORAL | 0 refills | Status: DC
Start: 2018-11-03 — End: 2018-11-08
  Administered 2018-11-03 – 2018-11-07 (×5): 1 via ORAL

## 2018-11-03 MED ORDER — POTASSIUM PHOSPHATE IVPB-HIGH
24 MMOL | Freq: Once | INTRAVENOUS | 0 refills | Status: CP
Start: 2018-11-03 — End: ?
  Administered 2018-11-03 (×2): 24 mmol via INTRAVENOUS

## 2018-11-03 MED ORDER — ASCORBIC ACID (VITAMIN C) 500 MG PO TAB
500 mg | Freq: Every day | ORAL | 0 refills | Status: DC
Start: 2018-11-03 — End: 2018-11-08
  Administered 2018-11-03 – 2018-11-07 (×5): 500 mg via ORAL

## 2018-11-03 MED ORDER — MORPHINE 2 MG/ML IV CRTG
1-2 mg | INTRAVENOUS | 0 refills | Status: AC | PRN
Start: 2018-11-03 — End: ?
  Administered 2018-11-03: 22:00:00 1 mg via INTRAVENOUS

## 2018-11-03 MED ORDER — SODIUM CHLORIDE 0.9 % IV SOLP
INTRAVENOUS | 0 refills | Status: DC
Start: 2018-11-03 — End: 2018-11-06
  Administered 2018-11-03 – 2018-11-05 (×4): 1000.000 mL via INTRAVENOUS

## 2018-11-03 MED ORDER — ZINC SULFATE 220 (50) MG PO CAP
220 mg | Freq: Every day | ORAL | 0 refills | Status: DC
Start: 2018-11-03 — End: 2018-11-08
  Administered 2018-11-03: 19:00:00 220 mg via ORAL

## 2018-11-03 MED ORDER — MAGNESIUM SULFATE IN D5W 1 GRAM/100 ML IV PGBK
1 g | INTRAVENOUS | 0 refills | Status: CP
Start: 2018-11-03 — End: ?
  Administered 2018-11-03 (×4): 1 g via INTRAVENOUS

## 2018-11-03 MED ORDER — MIDAZOLAM 1 MG/ML IJ SOLN
1-2 mg | INTRAVENOUS | 0 refills | Status: DC | PRN
Start: 2018-11-03 — End: 2018-11-08
  Administered 2018-11-03: 22:00:00 1 mg via INTRAVENOUS

## 2018-11-03 MED ORDER — MORPHINE 2 MG/ML IV CRTG
0 refills | Status: DC
Start: 2018-11-03 — End: 2018-11-08
  Administered 2018-11-03: 22:00:00 1 mg via INTRAVENOUS

## 2018-11-04 ENCOUNTER — Encounter: Admit: 2018-11-04 | Discharge: 2018-11-04 | Payer: MEDICARE

## 2018-11-04 DIAGNOSIS — K219 Gastro-esophageal reflux disease without esophagitis: ICD-10-CM

## 2018-11-04 DIAGNOSIS — I10 Essential (primary) hypertension: ICD-10-CM

## 2018-11-04 DIAGNOSIS — E785 Hyperlipidemia, unspecified: Principal | ICD-10-CM

## 2018-11-04 DIAGNOSIS — M549 Dorsalgia, unspecified: ICD-10-CM

## 2018-11-04 DIAGNOSIS — H547 Unspecified visual loss: ICD-10-CM

## 2018-11-04 DIAGNOSIS — I2510 Atherosclerotic heart disease of native coronary artery without angina pectoris: ICD-10-CM

## 2018-11-04 DIAGNOSIS — I6529 Occlusion and stenosis of unspecified carotid artery: ICD-10-CM

## 2018-11-04 DIAGNOSIS — Z9289 Personal history of other medical treatment: ICD-10-CM

## 2018-11-04 DIAGNOSIS — R51 Headache: ICD-10-CM

## 2018-11-04 DIAGNOSIS — H9190 Unspecified hearing loss, unspecified ear: ICD-10-CM

## 2018-11-04 DIAGNOSIS — R112 Nausea with vomiting, unspecified: ICD-10-CM

## 2018-11-04 DIAGNOSIS — R0602 Shortness of breath: ICD-10-CM

## 2018-11-04 DIAGNOSIS — Z955 Presence of coronary angioplasty implant and graft: ICD-10-CM

## 2018-11-04 DIAGNOSIS — I208 Other forms of angina pectoris: ICD-10-CM

## 2018-11-04 LAB — COMPREHENSIVE METABOLIC PANEL
Lab: 0.8 mg/dL (ref 0.4–1.00)
Lab: 0.9 mg/dL (ref >60)
Lab: 109 MMOL/L — ABNORMAL LOW (ref 98–110)
Lab: 11 mg/dL (ref 7–25)
Lab: 138 MMOL/L — ABNORMAL LOW (ref 137–147)
Lab: 2.1 g/dL — ABNORMAL LOW (ref 3.5–5.0)
Lab: 21 MMOL/L (ref 21–30)
Lab: 22 U/L (ref 7–40)
Lab: 3.6 MMOL/L — ABNORMAL LOW (ref 3.5–5.1)
Lab: 314 U/L — ABNORMAL HIGH (ref 25–110)
Lab: 5.5 g/dL — ABNORMAL LOW (ref >60)
Lab: 7.1 mg/dL — ABNORMAL LOW (ref 8.5–10.6)
Lab: 8 (ref 3–12)
Lab: 9 U/L (ref 7–56)
Lab: 95 mg/dL (ref 70–100)
Lab: >60 mL/min (ref >60)
Lab: >60 mL/min (ref >60)

## 2018-11-04 LAB — GRAM STAIN

## 2018-11-04 LAB — CBC: Lab: 10 K/UL — ABNORMAL LOW (ref 4.5–11.0)

## 2018-11-04 MED ORDER — BANANA BAG (~~LOC~~) 1000ML
Freq: Once | INTRAVENOUS | 0 refills | Status: CP
Start: 2018-11-04 — End: ?
  Administered 2018-11-05 (×4): 1001.200 mL via INTRAVENOUS

## 2018-11-04 MED ORDER — LOPERAMIDE 2 MG PO CAP
2 mg | ORAL | 0 refills | Status: DC | PRN
Start: 2018-11-04 — End: 2018-11-08
  Administered 2018-11-04 – 2018-11-05 (×2): 2 mg via ORAL

## 2018-11-04 MED ORDER — LACTATED RINGERS IV SOLP
INTRAVENOUS | 0 refills | Status: DC
Start: 2018-11-04 — End: 2018-11-06
  Administered 2018-11-04: 17:00:00 1000 mL via INTRAVENOUS

## 2018-11-04 MED ORDER — NITROFURANTOIN MONOHYD/M-CRYST 100 MG PO CAP
100 mg | Freq: Two times a day (BID) | ORAL | 0 refills | Status: DC
Start: 2018-11-04 — End: 2018-11-05

## 2018-11-04 MED ORDER — ONDANSETRON HCL (PF) 4 MG/2 ML IJ SOLN
4 mg | Freq: Once | INTRAVENOUS | 0 refills | Status: CN | PRN
Start: 2018-11-04 — End: ?

## 2018-11-04 MED ORDER — MELATONIN 3 MG PO TAB
3 mg | Freq: Every evening | ORAL | 0 refills | Status: DC
Start: 2018-11-04 — End: 2018-11-08
  Administered 2018-11-05 – 2018-11-07 (×3): 3 mg via ORAL

## 2018-11-04 MED ORDER — LIDOCAINE (PF) 200 MG/10 ML (2 %) IJ SYRG
0 refills | Status: DC
Start: 2018-11-04 — End: 2018-11-04
  Administered 2018-11-04: 17:00:00 100 mg via INTRAVENOUS

## 2018-11-04 MED ORDER — PROPOFOL 10 MG/ML IV EMUL 20 ML (INFUSION)(AM)(OR)
INTRAVENOUS | 0 refills | Status: DC
Start: 2018-11-04 — End: 2018-11-04
  Administered 2018-11-04: 17:00:00 50000 ug via INTRAVENOUS

## 2018-11-05 ENCOUNTER — Encounter: Admit: 2018-11-05 | Discharge: 2018-11-05 | Payer: MEDICARE

## 2018-11-05 DIAGNOSIS — Z955 Presence of coronary angioplasty implant and graft: ICD-10-CM

## 2018-11-05 DIAGNOSIS — H547 Unspecified visual loss: ICD-10-CM

## 2018-11-05 DIAGNOSIS — I2510 Atherosclerotic heart disease of native coronary artery without angina pectoris: ICD-10-CM

## 2018-11-05 DIAGNOSIS — R51 Headache: ICD-10-CM

## 2018-11-05 DIAGNOSIS — R112 Nausea with vomiting, unspecified: ICD-10-CM

## 2018-11-05 DIAGNOSIS — R0602 Shortness of breath: ICD-10-CM

## 2018-11-05 DIAGNOSIS — H9190 Unspecified hearing loss, unspecified ear: ICD-10-CM

## 2018-11-05 DIAGNOSIS — I10 Essential (primary) hypertension: ICD-10-CM

## 2018-11-05 DIAGNOSIS — Z9289 Personal history of other medical treatment: ICD-10-CM

## 2018-11-05 DIAGNOSIS — M549 Dorsalgia, unspecified: ICD-10-CM

## 2018-11-05 DIAGNOSIS — I208 Other forms of angina pectoris: ICD-10-CM

## 2018-11-05 DIAGNOSIS — K921 Melena: Principal | ICD-10-CM

## 2018-11-05 DIAGNOSIS — I6529 Occlusion and stenosis of unspecified carotid artery: ICD-10-CM

## 2018-11-05 DIAGNOSIS — E785 Hyperlipidemia, unspecified: Principal | ICD-10-CM

## 2018-11-05 DIAGNOSIS — K219 Gastro-esophageal reflux disease without esophagitis: ICD-10-CM

## 2018-11-05 LAB — H. PYLORI AG, FECES: Lab: NEGATIVE

## 2018-11-05 LAB — FOLATE, SERUM: Lab: 12 ng/mL (ref >3.9)

## 2018-11-05 LAB — IRON + BINDING CAPACITY + %SAT+ FERRITIN
Lab: 171 ug/dL — ABNORMAL LOW (ref 270–380)
Lab: 33 % (ref 28–42)
Lab: 412 ng/mL — ABNORMAL HIGH (ref 10–200)
Lab: 57 ug/dL (ref 50–160)

## 2018-11-05 LAB — CBC: Lab: 11 K/UL — ABNORMAL LOW (ref 4.5–11.0)

## 2018-11-05 LAB — VITAMIN B12: Lab: 352 pg/mL (ref 180–914)

## 2018-11-05 LAB — 25-OH VITAMIN D (D2 + D3): Lab: 24 ng/mL — ABNORMAL LOW (ref 30–80)

## 2018-11-05 LAB — COMPREHENSIVE METABOLIC PANEL: Lab: 140 MMOL/L — ABNORMAL LOW (ref 137–147)

## 2018-11-05 MED ORDER — BENZOCAINE-MENTHOL 6-10 MG MM LOZG
1 | ORAL | 0 refills | Status: DC | PRN
Start: 2018-11-05 — End: 2018-11-08
  Administered 2018-11-06: 03:00:00 1 via ORAL

## 2018-11-05 MED ORDER — FUROSEMIDE 10 MG/ML IJ SOLN
20 mg | Freq: Once | INTRAVENOUS | 0 refills | Status: CP
Start: 2018-11-05 — End: ?
  Administered 2018-11-05: 18:00:00 20 mg via INTRAVENOUS

## 2018-11-05 MED ORDER — POTASSIUM CHLORIDE 20 MEQ PO TBTQ
40 meq | ORAL | 0 refills | Status: CP
Start: 2018-11-05 — End: ?
  Administered 2018-11-05 (×3): 40 meq via ORAL

## 2018-11-05 MED ORDER — ERGOCALCIFEROL (VITAMIN D2) 50,000 UNIT PO CAP
50000 [IU] | Freq: Every day | ORAL | 0 refills | Status: DC
Start: 2018-11-05 — End: 2018-11-05
  Administered 2018-11-05: 14:00:00 50000 [IU] via ORAL

## 2018-11-05 MED ORDER — ERGOCALCIFEROL (VITAMIN D2) 50,000 UNIT PO CAP
50000 [IU] | ORAL | 0 refills | Status: DC
Start: 2018-11-05 — End: 2018-11-08

## 2018-11-06 ENCOUNTER — Encounter: Admit: 2018-11-06 | Discharge: 2018-11-06 | Payer: MEDICARE

## 2018-11-06 LAB — CULTURE-URINE W/SENSITIVITY

## 2018-11-06 LAB — CBC: Lab: 13 K/UL — ABNORMAL HIGH (ref 4.5–11.0)

## 2018-11-06 LAB — COMPREHENSIVE METABOLIC PANEL: Lab: 136 MMOL/L — ABNORMAL LOW (ref 137–147)

## 2018-11-06 MED ORDER — POTASSIUM CHLORIDE 20 MEQ PO TBTQ
40 meq | Freq: Two times a day (BID) | ORAL | 0 refills | Status: CP
Start: 2018-11-06 — End: ?
  Administered 2018-11-06 (×2): 40 meq via ORAL

## 2018-11-07 ENCOUNTER — Inpatient Hospital Stay: Admit: 2018-11-03 | Discharge: 2018-11-03 | Payer: MEDICARE

## 2018-11-07 ENCOUNTER — Inpatient Hospital Stay: Admit: 2018-11-01 | Discharge: 2018-11-01 | Payer: MEDICARE

## 2018-11-07 ENCOUNTER — Inpatient Hospital Stay: Admit: 2018-11-07 | Discharge: 2018-11-07 | Payer: MEDICARE

## 2018-11-07 ENCOUNTER — Inpatient Hospital Stay: Admit: 2018-11-05 | Discharge: 2018-11-05 | Payer: MEDICARE

## 2018-11-07 ENCOUNTER — Inpatient Hospital Stay
Admit: 2018-11-01 | Discharge: 2018-11-07 | Disposition: A | Discharge status: Home Health | Payer: MEDICARE | Source: Other Acute Inpatient Hospital | Attending: Gynecology | Admitting: Gynecologic Oncology

## 2018-11-07 ENCOUNTER — Encounter: Admit: 2018-11-07 | Discharge: 2018-11-07 | Payer: MEDICARE

## 2018-11-07 DIAGNOSIS — K317 Polyp of stomach and duodenum: ICD-10-CM

## 2018-11-07 DIAGNOSIS — R197 Diarrhea, unspecified: ICD-10-CM

## 2018-11-07 DIAGNOSIS — K921 Melena: Principal | ICD-10-CM

## 2018-11-07 DIAGNOSIS — Z87440 Personal history of urinary (tract) infections: ICD-10-CM

## 2018-11-07 DIAGNOSIS — K219 Gastro-esophageal reflux disease without esophagitis: ICD-10-CM

## 2018-11-07 DIAGNOSIS — N739 Female pelvic inflammatory disease, unspecified: ICD-10-CM

## 2018-11-07 DIAGNOSIS — N390 Urinary tract infection, site not specified: ICD-10-CM

## 2018-11-07 DIAGNOSIS — Z885 Allergy status to narcotic agent status: ICD-10-CM

## 2018-11-07 DIAGNOSIS — Z955 Presence of coronary angioplasty implant and graft: ICD-10-CM

## 2018-11-07 DIAGNOSIS — Z882 Allergy status to sulfonamides status: ICD-10-CM

## 2018-11-07 DIAGNOSIS — D62 Acute posthemorrhagic anemia: ICD-10-CM

## 2018-11-07 DIAGNOSIS — I959 Hypotension, unspecified: ICD-10-CM

## 2018-11-07 DIAGNOSIS — R5381 Other malaise: ICD-10-CM

## 2018-11-07 DIAGNOSIS — E785 Hyperlipidemia, unspecified: ICD-10-CM

## 2018-11-07 DIAGNOSIS — C786 Secondary malignant neoplasm of retroperitoneum and peritoneum: ICD-10-CM

## 2018-11-07 DIAGNOSIS — K2970 Gastritis, unspecified, without bleeding: ICD-10-CM

## 2018-11-07 DIAGNOSIS — Z7982 Long term (current) use of aspirin: ICD-10-CM

## 2018-11-07 DIAGNOSIS — K31819 Angiodysplasia of stomach and duodenum without bleeding: ICD-10-CM

## 2018-11-07 DIAGNOSIS — Z90710 Acquired absence of both cervix and uterus: ICD-10-CM

## 2018-11-07 DIAGNOSIS — D638 Anemia in other chronic diseases classified elsewhere: ICD-10-CM

## 2018-11-07 DIAGNOSIS — Z79899 Other long term (current) drug therapy: ICD-10-CM

## 2018-11-07 DIAGNOSIS — Z8541 Personal history of malignant neoplasm of cervix uteri: ICD-10-CM

## 2018-11-07 DIAGNOSIS — I2510 Atherosclerotic heart disease of native coronary artery without angina pectoris: ICD-10-CM

## 2018-11-07 DIAGNOSIS — T8143XA Infection following a procedure, organ and space surgical site, initial encounter: ICD-10-CM

## 2018-11-07 DIAGNOSIS — K449 Diaphragmatic hernia without obstruction or gangrene: ICD-10-CM

## 2018-11-07 DIAGNOSIS — Z9049 Acquired absence of other specified parts of digestive tract: ICD-10-CM

## 2018-11-07 DIAGNOSIS — R32 Unspecified urinary incontinence: ICD-10-CM

## 2018-11-07 DIAGNOSIS — R188 Other ascites: ICD-10-CM

## 2018-11-07 DIAGNOSIS — Z888 Allergy status to other drugs, medicaments and biological substances status: ICD-10-CM

## 2018-11-07 LAB — PORPHOBLINOGEN-URINE RANDOM: Lab: 0.1 umol/L (ref 25–110)

## 2018-11-07 LAB — CULTURE-BLOOD W/SENSITIVITY

## 2018-11-07 MED ORDER — SODIUM CHLORIDE 0.9 % IJ SYRG
10 mL | Freq: Two times a day (BID) | INTRAVENOUS | 0 refills | Status: DC
Start: 2018-11-07 — End: 2018-11-08

## 2018-11-08 LAB — CULTURE-WOUND/TISSUE/FLUID(AEROBIC ONLY)W/SENSITIVITY

## 2018-11-10 ENCOUNTER — Encounter: Admit: 2018-11-10 | Discharge: 2018-11-10 | Payer: MEDICARE

## 2018-11-10 DIAGNOSIS — C539 Malignant neoplasm of cervix uteri, unspecified: ICD-10-CM

## 2018-11-10 DIAGNOSIS — R112 Nausea with vomiting, unspecified: ICD-10-CM

## 2018-11-10 DIAGNOSIS — I2510 Atherosclerotic heart disease of native coronary artery without angina pectoris: ICD-10-CM

## 2018-11-10 DIAGNOSIS — I10 Essential (primary) hypertension: ICD-10-CM

## 2018-11-10 DIAGNOSIS — R05 Cough: ICD-10-CM

## 2018-11-10 DIAGNOSIS — R51 Headache: ICD-10-CM

## 2018-11-10 DIAGNOSIS — J90 Pleural effusion, not elsewhere classified: ICD-10-CM

## 2018-11-10 DIAGNOSIS — H547 Unspecified visual loss: ICD-10-CM

## 2018-11-10 DIAGNOSIS — R0602 Shortness of breath: ICD-10-CM

## 2018-11-10 DIAGNOSIS — M549 Dorsalgia, unspecified: ICD-10-CM

## 2018-11-10 DIAGNOSIS — H9190 Unspecified hearing loss, unspecified ear: ICD-10-CM

## 2018-11-10 DIAGNOSIS — I6529 Occlusion and stenosis of unspecified carotid artery: ICD-10-CM

## 2018-11-10 DIAGNOSIS — C541 Malignant neoplasm of endometrium: Principal | ICD-10-CM

## 2018-11-10 DIAGNOSIS — K219 Gastro-esophageal reflux disease without esophagitis: ICD-10-CM

## 2018-11-10 DIAGNOSIS — N390 Urinary tract infection, site not specified: ICD-10-CM

## 2018-11-10 DIAGNOSIS — E785 Hyperlipidemia, unspecified: Principal | ICD-10-CM

## 2018-11-10 DIAGNOSIS — Z9289 Personal history of other medical treatment: ICD-10-CM

## 2018-11-10 DIAGNOSIS — I208 Other forms of angina pectoris: ICD-10-CM

## 2018-11-10 DIAGNOSIS — Z955 Presence of coronary angioplasty implant and graft: ICD-10-CM

## 2018-11-10 LAB — CBC AND DIFF
Lab: 0 % (ref >60)
Lab: 0 % (ref >60)
Lab: 15 K/UL — ABNORMAL HIGH (ref 4.5–11.0)
Lab: 17 % — ABNORMAL LOW (ref 41–77)
Lab: 2.5 10*3/uL (ref 1.8–7.0)
Lab: 20 % — ABNORMAL HIGH (ref 11–15)
Lab: 289 K/UL — ABNORMAL HIGH (ref 150–400)
Lab: 3.1 M/UL — ABNORMAL LOW (ref 4.0–5.0)
Lab: 31 % — ABNORMAL LOW (ref 36–45)
Lab: 32 g/dL (ref 32.0–36.0)
Lab: 5 % (ref 4–12)
Lab: 78 % — ABNORMAL HIGH (ref 24–44)
Lab: 8.3 FL (ref 7–11)
Lab: 99 FL — ABNORMAL LOW (ref 80–100)

## 2018-11-10 LAB — COMPREHENSIVE METABOLIC PANEL
Lab: 132 MMOL/L — ABNORMAL LOW (ref 137–147)
Lab: 2.9 MMOL/L — ABNORMAL LOW (ref 3.5–5.1)

## 2018-11-11 ENCOUNTER — Encounter: Admit: 2018-11-11 | Discharge: 2018-11-11 | Payer: MEDICARE

## 2018-11-11 LAB — PROCALCITONIN: Lab: 0.1 ng/mL — ABNORMAL HIGH (ref <0.11)

## 2018-11-11 LAB — C REACTIVE PROTEIN (CRP): Lab: 4.9 mg/dL — ABNORMAL HIGH (ref <1.0)

## 2018-11-12 LAB — CULTURE-URINE W/SENSITIVITY

## 2018-11-13 ENCOUNTER — Encounter: Admit: 2018-11-13 | Discharge: 2018-11-13 | Payer: MEDICARE

## 2018-11-13 DIAGNOSIS — C539 Malignant neoplasm of cervix uteri, unspecified: Principal | ICD-10-CM

## 2018-11-13 MED ORDER — IOHEXOL 350 MG IODINE/ML IV SOLN
100 mL | Freq: Once | INTRAVENOUS | 0 refills | Status: CP
Start: 2018-11-13 — End: ?
  Administered 2018-11-13: 18:00:00 100 mL via INTRAVENOUS

## 2018-11-13 MED ORDER — SODIUM CHLORIDE 0.9 % IJ SOLN
50 mL | Freq: Once | INTRAVENOUS | 0 refills | Status: CP
Start: 2018-11-13 — End: ?
  Administered 2018-11-13: 18:00:00 50 mL via INTRAVENOUS

## 2018-11-14 ENCOUNTER — Encounter: Admit: 2018-11-14 | Discharge: 2018-11-14 | Payer: MEDICARE

## 2018-11-25 ENCOUNTER — Encounter: Admit: 2018-11-25 | Discharge: 2018-11-25 | Payer: MEDICARE

## 2018-11-26 ENCOUNTER — Encounter: Admit: 2018-11-26 | Discharge: 2018-11-26 | Payer: MEDICARE

## 2018-11-27 ENCOUNTER — Encounter: Admit: 2018-11-27 | Discharge: 2018-11-27 | Payer: MEDICARE

## 2018-12-28 LAB — COMPREHENSIVE METABOLIC PANEL
Lab: 0.9 — ABNORMAL LOW (ref 33.0–37.0)
Lab: 1.5 — ABNORMAL HIGH (ref 0.0–1.0)
Lab: 12 — ABNORMAL HIGH (ref 27.0–31.0)
Lab: 120 — ABNORMAL HIGH (ref 70–105)
Lab: 13
Lab: 135 — ABNORMAL LOW (ref 136–145)
Lab: 18
Lab: 2.7 — ABNORMAL LOW (ref 3.4–4.8)
Lab: 21 — ABNORMAL LOW (ref 23–31)
Lab: 484 — ABNORMAL HIGH (ref 40–150)
Lab: 49 — ABNORMAL HIGH (ref 5–34)
Lab: 61
Lab: 8.4

## 2018-12-28 LAB — CBC
Lab: 11 — ABNORMAL LOW (ref 12.0–16.0)
Lab: 16 — ABNORMAL HIGH (ref 11.5–14.5)
Lab: 34 — ABNORMAL LOW (ref 37.0–47.0)
Lab: 9.6

## 2019-01-06 LAB — COMPREHENSIVE METABOLIC PANEL
Lab: 135 — ABNORMAL LOW (ref 136–145)
Lab: 15 — ABNORMAL HIGH (ref 3–12)
Lab: 21 — ABNORMAL LOW (ref 22–29)
Lab: 3.8 — ABNORMAL LOW (ref 12.0–16.0)
Lab: 99 — ABNORMAL LOW (ref 35–47)

## 2019-01-06 LAB — CBC
Lab: 34
Lab: 9.8

## 2019-01-26 ENCOUNTER — Encounter: Admit: 2019-01-26 | Discharge: 2019-01-26 | Payer: MEDICARE

## 2019-01-26 NOTE — Progress Notes
Records Request    Medical records request for continuation of care:    Patient has appointment on 02/10/2019   with  Dr. Nickolas Madrid* .    Please fax records to [facility]  [phone]    Request records:        H&P/Discharge Summary (Most recent)    EKG's           Recent Cardiac Testing          Thank you,      [facility]  [facility]  [address]  [place], [place] [address]  Phone:  [phone]  Fax:  [phone]

## 2019-02-10 ENCOUNTER — Encounter: Admit: 2019-02-10 | Discharge: 2019-02-10 | Payer: MEDICARE

## 2020-01-18 IMAGING — CR CHEST
2 series · 2 of 2 positions shown · non-contrast
Comparison: none

[chest pa]
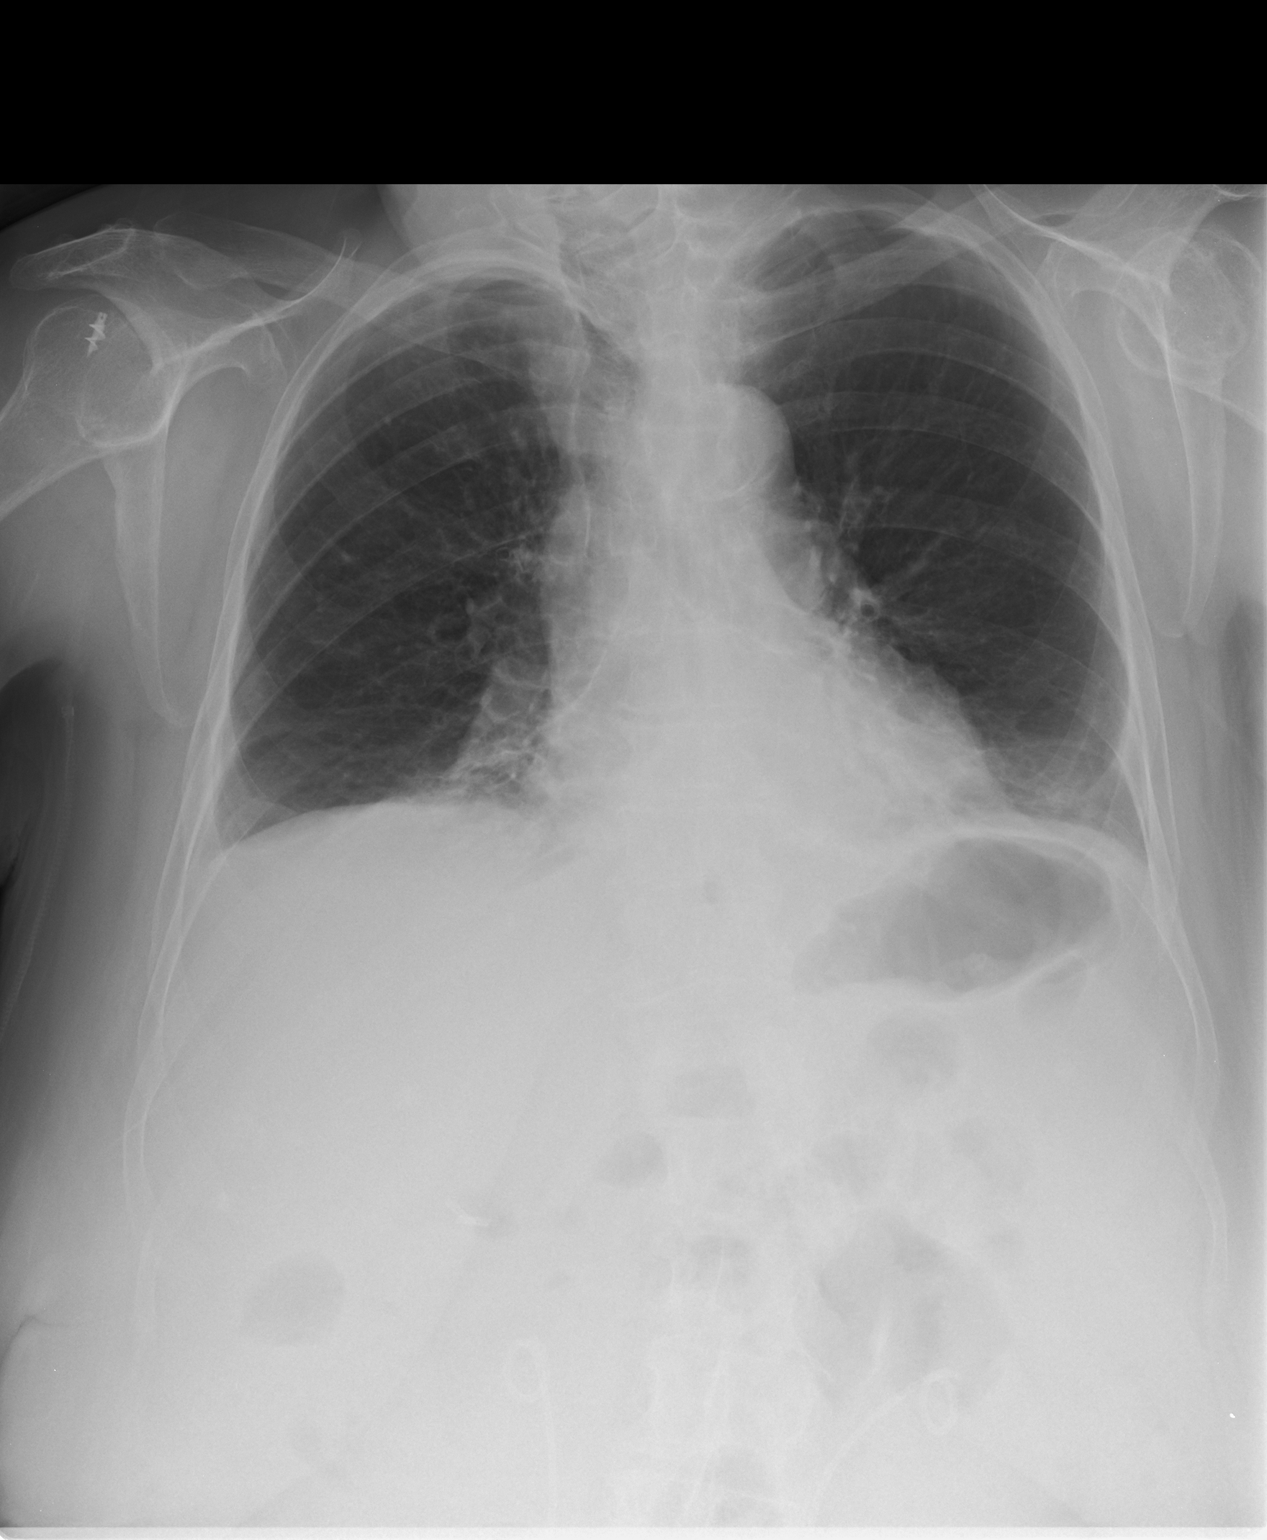

[chest lat]
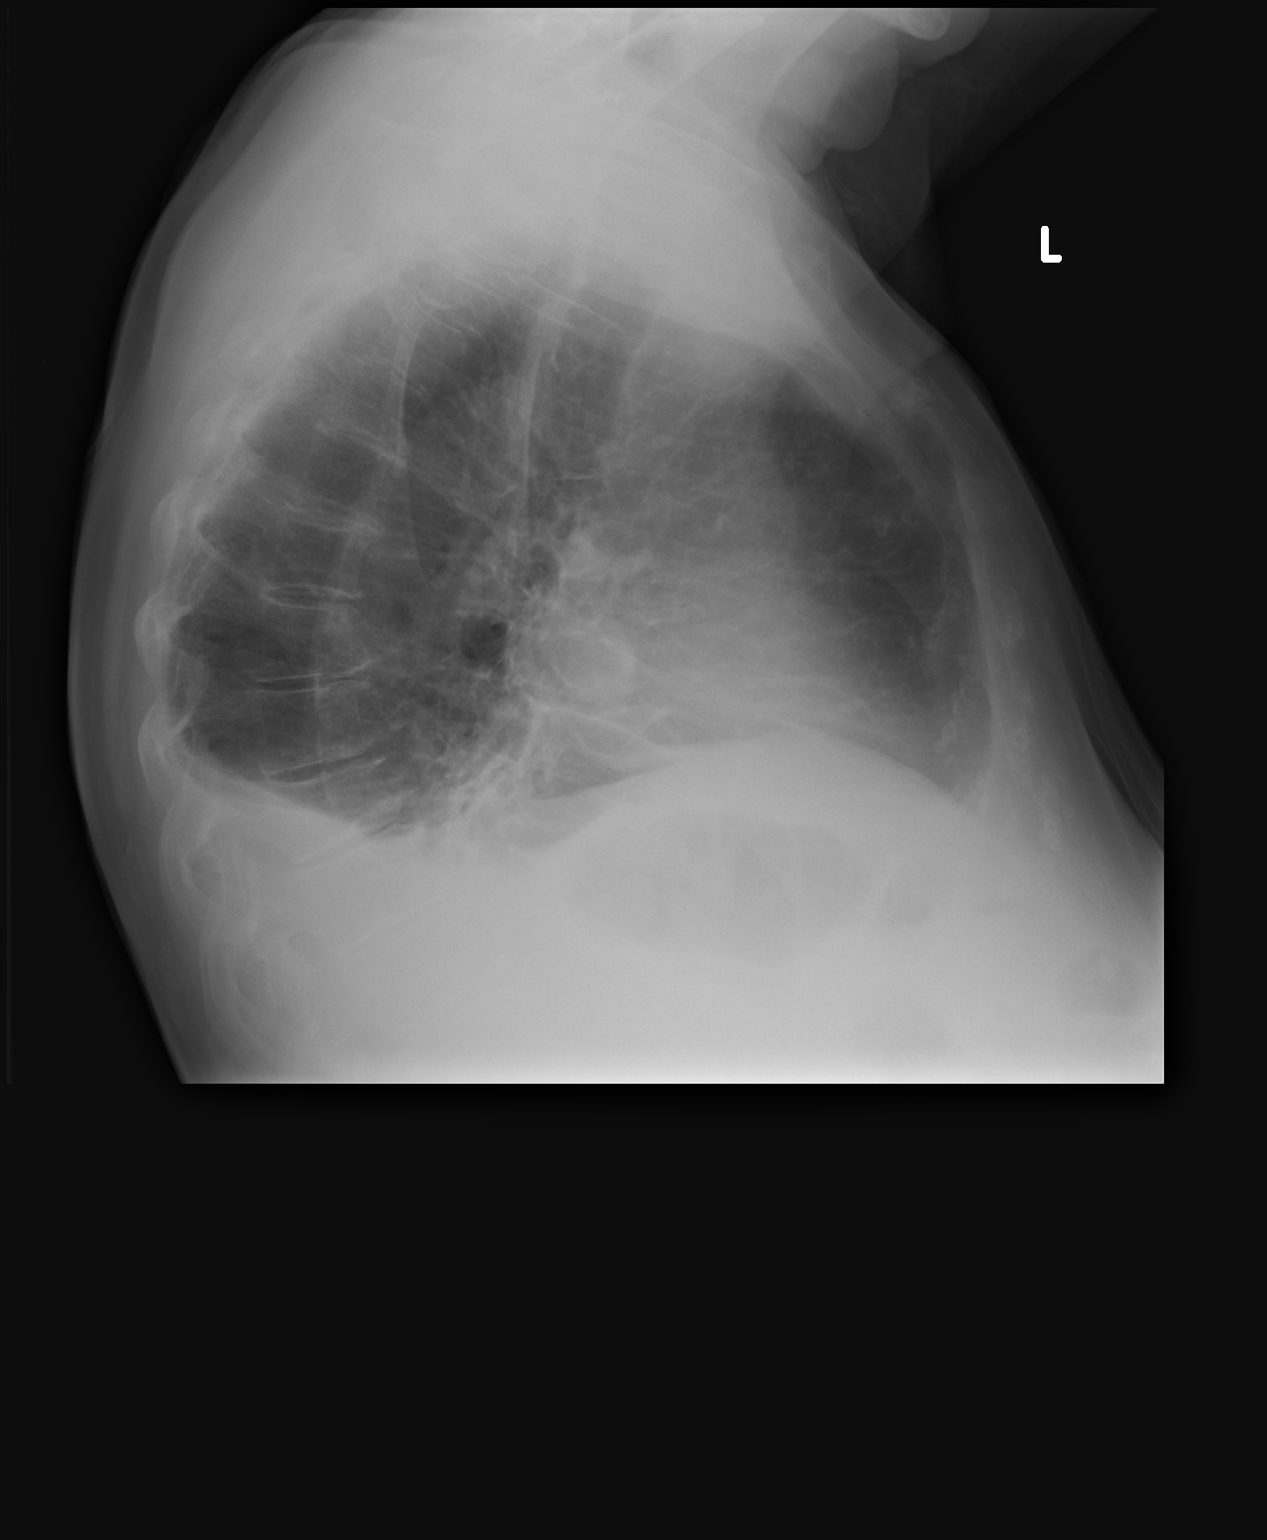

[2 of 2 positions shown; findings below may reference images not displayed]

Consulting: Saheki, Risako; Jim

DIAGNOSTIC STUDIES

EXAM
TWO VIEW CHEST

INDICATION
shortness of breath
pt c/o SOA. KB/AK

TECHNIQUE
PA and lateral chest views

COMPARISONS
01/03/2008

FINDINGS
The aorta is elongated and has calcification. The cardiac silhouette and pulmonary vasculature are
within normal limits minimal bibasilar atelectasis/ infiltrate is present. Small bilateral pleural
effusions are identified. Postop changes of the right rotator cuff are present. Bony
demineralization and degenerative changes are present. Accentuation of the thoracic kyphosis is
noted.

IMPRESSION
Bibasilar infiltrates/atelectasis with small bilateral pleural effusions.

Tech Notes:

pt c/o SOA. KB/AK

## 2020-03-02 IMAGING — CT ABDOMEN_PELVIS WO(Adult)
2 of 6 series · 11 of 46 positions shown, 12 images · non-contrast
Comparison: none

[Series 8: abdomen 3.00 br40 s3 ax · axial · 0.60mm/px · z∈[+1305,+1650]mm · 8 of 146 slices shown, 9 images]
[im 16/146  soft-tissue]
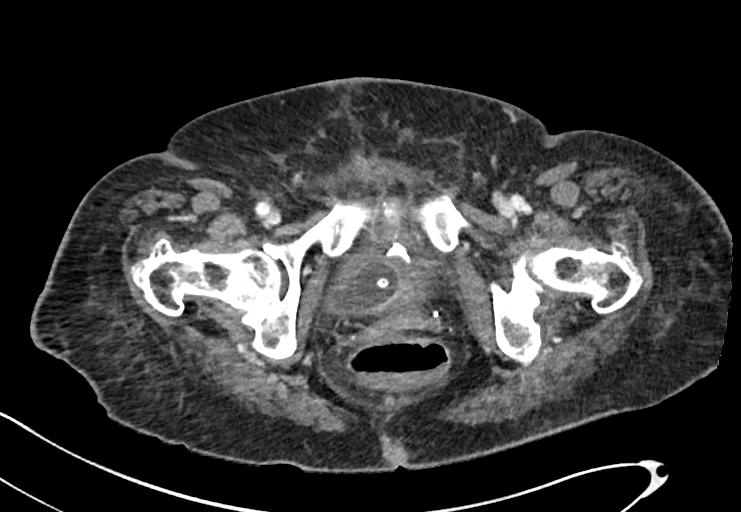
[im 16/146  bone]
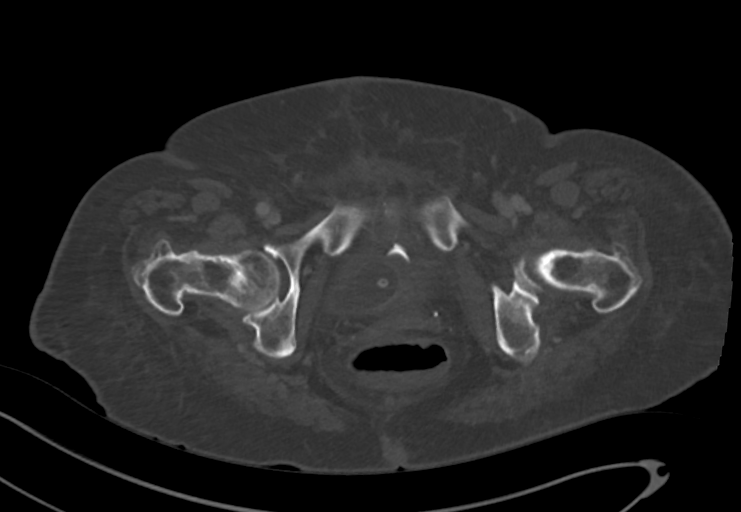
[im 31/146  soft-tissue]
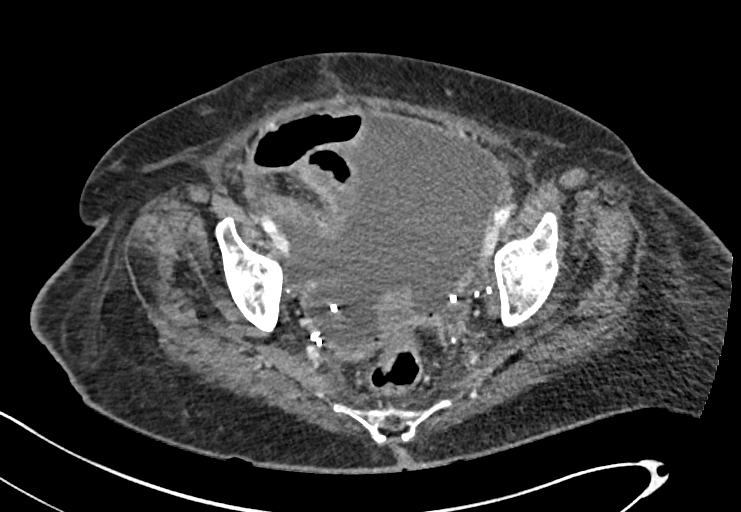
[im 46/146  soft-tissue]
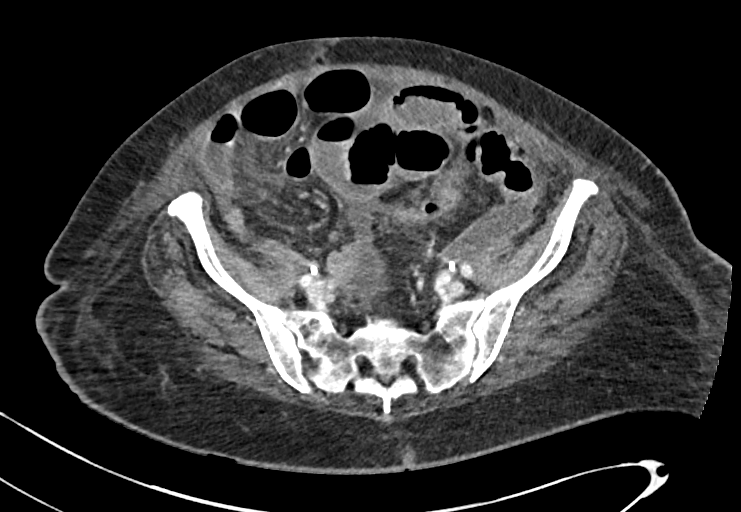
[im 62/146  soft-tissue]
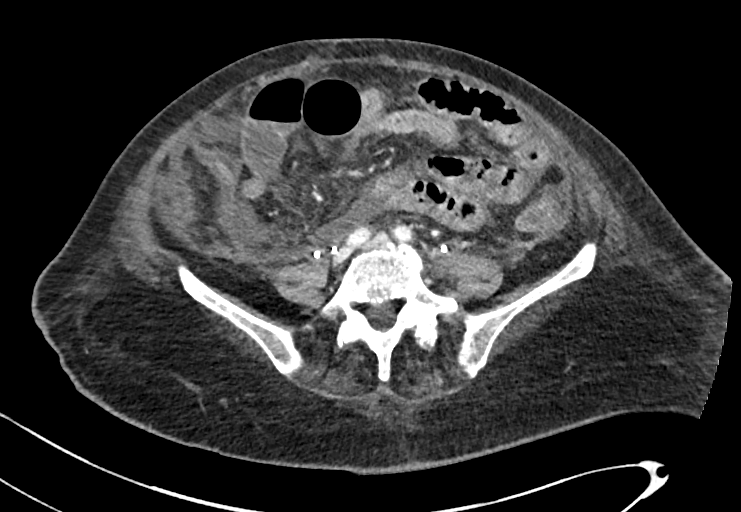
[im 84/146  soft-tissue]
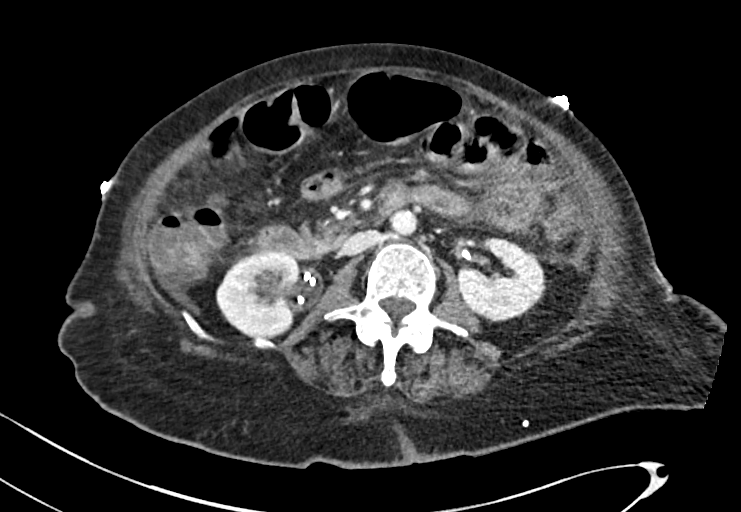
[im 100/146  soft-tissue]
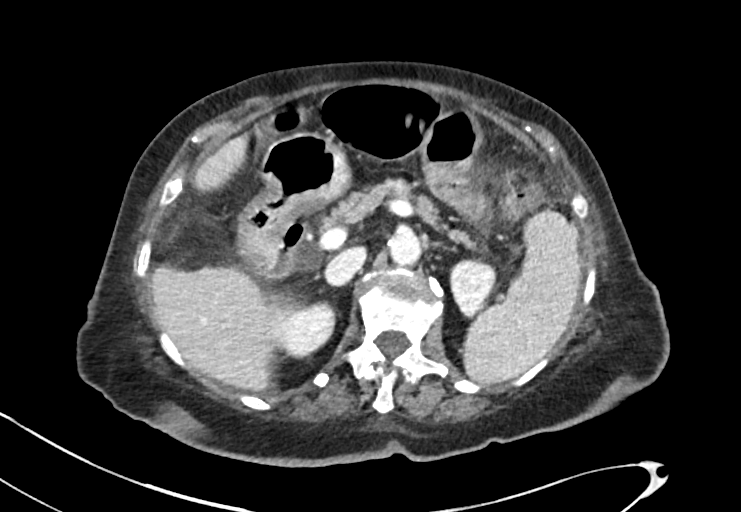
[im 115/146  soft-tissue]
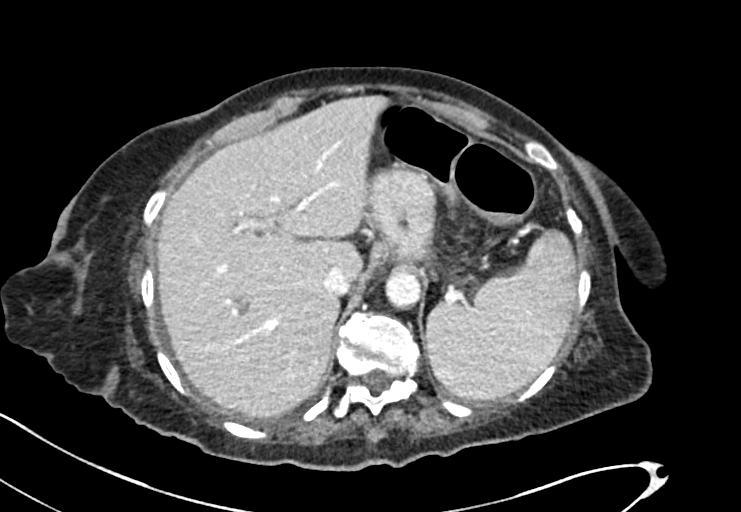
[im 130/146  soft-tissue]
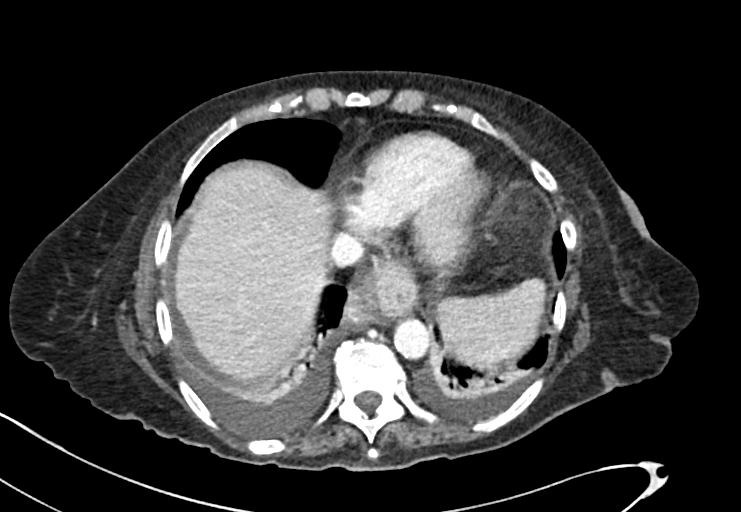

[Series 10: abdomen 3.00 br40 s3 cor · coronal · 0.87mm/px · 3 of 100 slices shown]
[im 20/100  soft-tissue]
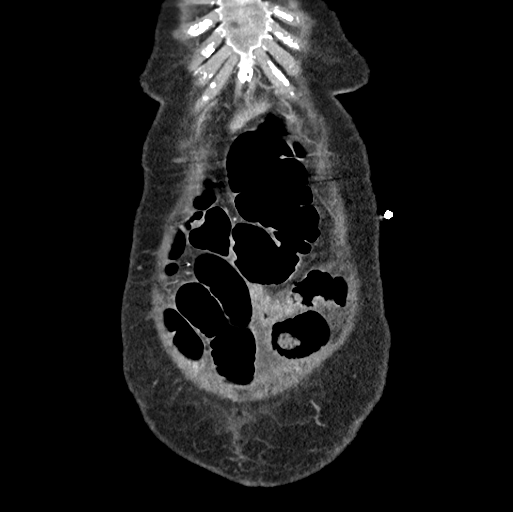
[im 40/100  soft-tissue]
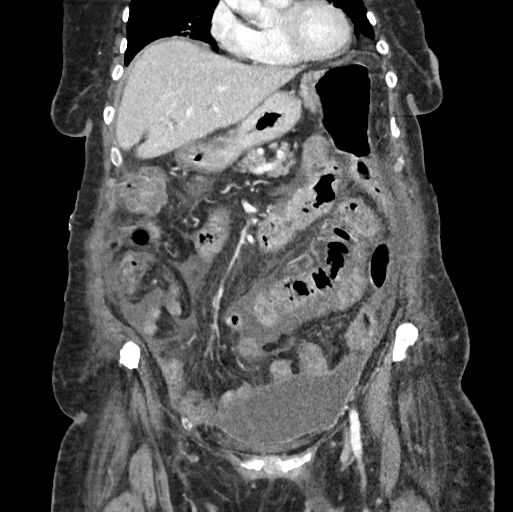
[im 60/100  soft-tissue]
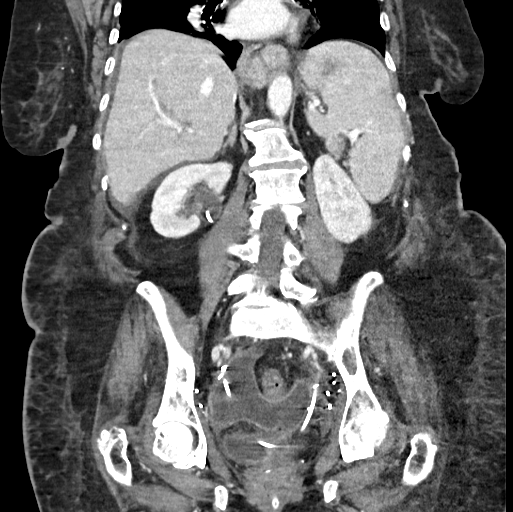

[11 of 46 positions shown; findings below may reference images not displayed]

EXAM

CT abd/pel wo/w con

INDICATION

Abdominal distention

TECHNIQUE

CT of the abdomen and pelvis was performed. All CT scans at this facility use dose modulation,
iterative reconstruction, and/or weight based dosing when appropriate to reduce radiation dose to as
low as reasonably achieved.

COMPARISONS

08/28/18

FINDINGS

Lung bases: There are small, right greater than left pleural effusions with associated atelectasis.
Normal cardiac size without a pericardial effusion.

Liver: There is normal hepatic size without a suspicious focal lesion.

Gallbladder and Biliary Tree: Status post prior cholecystectomy. There is no intrahepatic or
extrahepatic biliary dilation.

Spleen :Unremarkable

Pancreas: Diffuse fatty atrophy of the pancreas, which may be suggestive of underlying metabolic
derangement. No pancreatic ductal dilatation.

Adrenal Glands: Unremarkable

Kidneys: Symmetric appearance of the kidneys with bilateral double-J ureteral stents, in
appropriate positioning. Mild amount of right-sided pelviectasis and hydronephrosis.

Bladder: The bladder is significantly decompressed limiting its evaluation. There is a Foley
catheter with the Foley balloon likely at the base of the bladder.

Bowel: There is a small sliding hiatal hernia extending approximately 2.4 centimeters above the
diaphragmatic crura. Increased gas distention of the transverse colon and multiple small bowel
loops. There are subtle air-fluid levels within the small bowel and correlate for slow bowel transit
time. There is mild ascending colonic wall thickening which may be seen in the setting of portal
colopathy/3rd spacing. The appendix is not discretely visualized in the right lower quadrant.

Ascites: Interval decrease in size of the previously identified moderate amount of ascites. However
there is a persistent now loculated amount of intraperitoneal fluid at the level of the false
pelvis, superior to the bladder. This fluid collection measures approximately 13.4 x 11.8 x
centimeters. There is a small amount of gas possibly at the posterior aspect in the region of
radical hysterectomy.

Lymphadenopathy: No pathologically enlarged or morphologically abnormal lymph nodes by CT
appearance.

Abdominal Wall and Mesentery: Evidence of prior midline incision

Vasculature: Normal caliber without aneurysmal dilatation.

Pelvic Organs: Status post hysterectomy.

Musculoskeletal: There are degenerative changes of the visualized spine without evidence of an
aggressive osseous lesion or fracture.

IMPRESSION
- Interval decreased in amount of overall ascites, however now with a peripherally enhancing
loculated fluid collection with internal air located within the false pelvis. This fluid collection
may communicate with the region of prior hysterectomy. Differential consideration for in early
infected fluid collection as detailed above.
- Small bilateral pleural effusions.
- Mild right-sided hydronephrosis.

Tech Notes:

## 2020-03-03 IMAGING — CR CHEST
1 series · 1 of 1 positions shown · non-contrast
Comparison: none

[chest port x-wise]
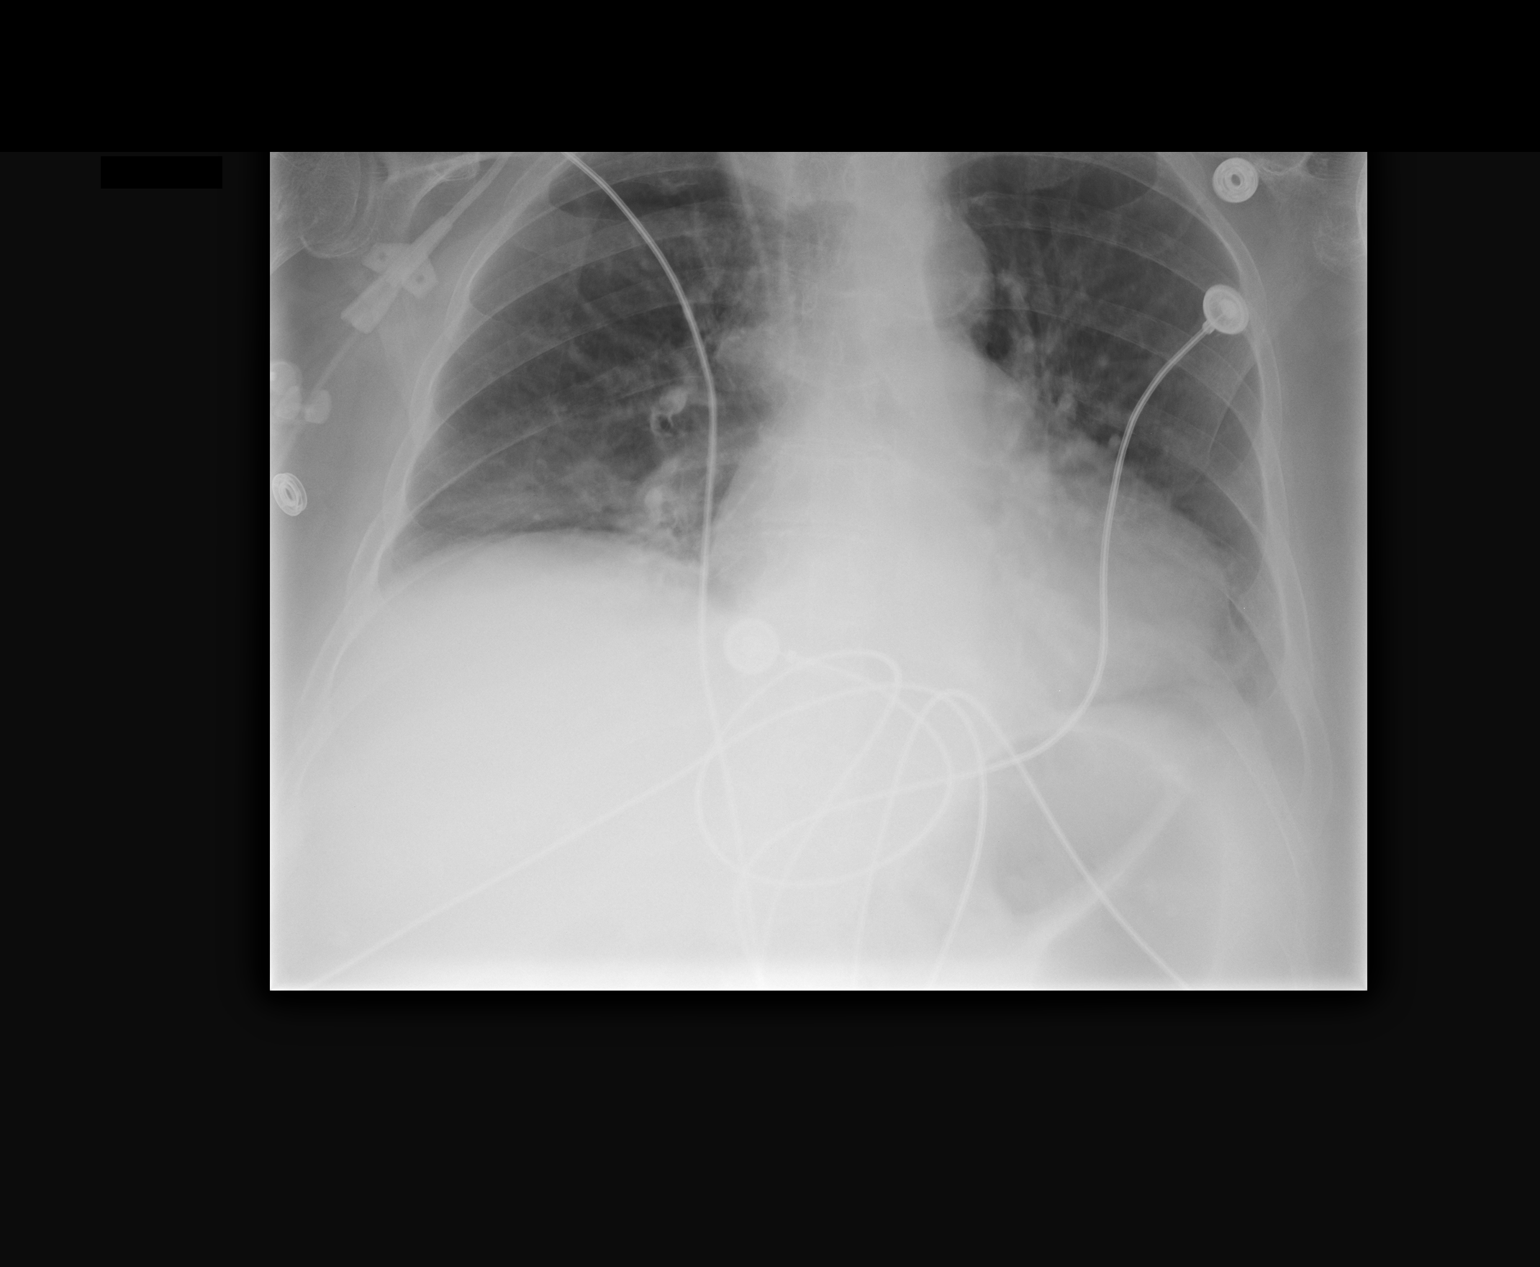

[1 of 1 positions shown; findings below may reference images not displayed]

Consulting: Tiger, Ourari; Orue, Guiseppi; Barbhuiya, Nahiduzzaman

EXAM

RADIOLOGICAL EXAMINATION, CHEST; 2 VIEWS FRONTAL AND LATERAL CPT 90494

INDICATION

SOA
soa, hx PE - Ak

TECHNIQUE

Portable chest

COMPARISONS

01/02/2018

FINDINGS

Right internal jugular catheter tip superior vena cava. Mild cardiomegaly. Decreased lung volumes
with possible mild congestion, bilateral effusions, and bibasilar lung atelectasis

IMPRESSION

Decreased lung volumes with bibasilar atelectasis and possible mild congestion

Tech Notes:

soa, hx PE - Ak

## 2020-07-24 DEATH — deceased

## 2023-05-19 ENCOUNTER — Encounter: Admit: 2023-05-19 | Discharge: 2023-05-19 | Payer: MEDICARE
